# Patient Record
Sex: Female | Born: 1970 | Race: White | Hispanic: No | Marital: Married | Smoking: Never smoker
Health system: Southern US, Community
[De-identification: ages and names within clinical notes are randomized; demographics above are authoritative.]

## PROBLEM LIST (undated history)

## (undated) DIAGNOSIS — G43909 Migraine, unspecified, not intractable, without status migrainosus: Secondary | ICD-10-CM

## (undated) DIAGNOSIS — K589 Irritable bowel syndrome without diarrhea: Secondary | ICD-10-CM

## (undated) DIAGNOSIS — F329 Major depressive disorder, single episode, unspecified: Secondary | ICD-10-CM

## (undated) DIAGNOSIS — F419 Anxiety disorder, unspecified: Secondary | ICD-10-CM

## (undated) DIAGNOSIS — T7840XA Allergy, unspecified, initial encounter: Secondary | ICD-10-CM

## (undated) DIAGNOSIS — G629 Polyneuropathy, unspecified: Secondary | ICD-10-CM

## (undated) DIAGNOSIS — J45901 Unspecified asthma with (acute) exacerbation: Secondary | ICD-10-CM

## (undated) DIAGNOSIS — F32A Depression, unspecified: Secondary | ICD-10-CM

## (undated) DIAGNOSIS — J0101 Acute recurrent maxillary sinusitis: Secondary | ICD-10-CM

## (undated) HISTORY — DX: Irritable bowel syndrome, unspecified: K58.9

## (undated) HISTORY — DX: Polyneuropathy, unspecified: G62.9

## (undated) HISTORY — DX: Allergy, unspecified, initial encounter: T78.40XA

## (undated) HISTORY — DX: Acute recurrent maxillary sinusitis: J01.01

## (undated) HISTORY — DX: Depression, unspecified: F32.A

## (undated) HISTORY — DX: Anxiety disorder, unspecified: F41.9

## (undated) HISTORY — DX: Unspecified asthma with (acute) exacerbation: J45.901

## (undated) HISTORY — PX: LUMBAR DISC SURGERY: SHX700

## (undated) HISTORY — DX: Migraine, unspecified, not intractable, without status migrainosus: G43.909

## (undated) HISTORY — PX: NASAL SEPTUM SURGERY: SHX37

## (undated) HISTORY — DX: Major depressive disorder, single episode, unspecified: F32.9

---

## 2002-02-15 ENCOUNTER — Emergency Department (HOSPITAL_COMMUNITY): Admission: EM | Admit: 2002-02-15 | Discharge: 2002-02-15 | Payer: Self-pay | Admitting: Emergency Medicine

## 2002-02-23 ENCOUNTER — Encounter (INDEPENDENT_AMBULATORY_CARE_PROVIDER_SITE_OTHER): Payer: Self-pay | Admitting: *Deleted

## 2002-02-23 ENCOUNTER — Ambulatory Visit (HOSPITAL_COMMUNITY): Admission: RE | Admit: 2002-02-23 | Discharge: 2002-02-24 | Payer: Self-pay | Admitting: Specialist

## 2002-02-23 ENCOUNTER — Encounter: Payer: Self-pay | Admitting: Specialist

## 2002-07-08 ENCOUNTER — Ambulatory Visit (HOSPITAL_COMMUNITY): Admission: RE | Admit: 2002-07-08 | Discharge: 2002-07-08 | Payer: Self-pay

## 2002-10-02 ENCOUNTER — Encounter: Payer: Self-pay | Admitting: *Deleted

## 2002-10-02 ENCOUNTER — Ambulatory Visit (HOSPITAL_COMMUNITY): Admission: RE | Admit: 2002-10-02 | Discharge: 2002-10-02 | Payer: Self-pay

## 2003-01-25 ENCOUNTER — Inpatient Hospital Stay (HOSPITAL_COMMUNITY): Admission: AD | Admit: 2003-01-25 | Discharge: 2003-01-25 | Payer: Self-pay

## 2003-02-04 ENCOUNTER — Encounter: Admission: RE | Admit: 2003-02-04 | Discharge: 2003-02-04 | Payer: Self-pay

## 2003-02-25 ENCOUNTER — Ambulatory Visit (HOSPITAL_COMMUNITY): Admission: RE | Admit: 2003-02-25 | Discharge: 2003-02-25 | Payer: Self-pay

## 2003-02-25 ENCOUNTER — Encounter: Admission: RE | Admit: 2003-02-25 | Discharge: 2003-02-25 | Payer: Self-pay

## 2003-02-26 ENCOUNTER — Inpatient Hospital Stay (HOSPITAL_COMMUNITY): Admission: AD | Admit: 2003-02-26 | Discharge: 2003-03-01 | Payer: Self-pay

## 2004-02-18 ENCOUNTER — Encounter: Admission: RE | Admit: 2004-02-18 | Discharge: 2004-02-18 | Payer: Self-pay | Admitting: Specialist

## 2004-02-28 ENCOUNTER — Inpatient Hospital Stay (HOSPITAL_COMMUNITY): Admission: EM | Admit: 2004-02-28 | Discharge: 2004-03-01 | Payer: Self-pay | Admitting: Specialist

## 2004-02-29 ENCOUNTER — Encounter (INDEPENDENT_AMBULATORY_CARE_PROVIDER_SITE_OTHER): Payer: Self-pay | Admitting: *Deleted

## 2005-01-29 ENCOUNTER — Ambulatory Visit: Payer: Self-pay | Admitting: Family Medicine

## 2005-01-31 ENCOUNTER — Ambulatory Visit: Payer: Self-pay | Admitting: Family Medicine

## 2005-03-27 ENCOUNTER — Ambulatory Visit: Payer: Self-pay | Admitting: Internal Medicine

## 2006-01-01 ENCOUNTER — Inpatient Hospital Stay (HOSPITAL_COMMUNITY): Admission: AD | Admit: 2006-01-01 | Discharge: 2006-01-03 | Payer: Self-pay | Admitting: Obstetrics and Gynecology

## 2006-01-01 ENCOUNTER — Encounter (INDEPENDENT_AMBULATORY_CARE_PROVIDER_SITE_OTHER): Payer: Self-pay | Admitting: Specialist

## 2007-08-08 ENCOUNTER — Ambulatory Visit: Payer: Self-pay | Admitting: Family Medicine

## 2007-08-08 DIAGNOSIS — Z9189 Other specified personal risk factors, not elsewhere classified: Secondary | ICD-10-CM | POA: Insufficient documentation

## 2007-08-08 DIAGNOSIS — G609 Hereditary and idiopathic neuropathy, unspecified: Secondary | ICD-10-CM | POA: Insufficient documentation

## 2007-08-08 DIAGNOSIS — H6690 Otitis media, unspecified, unspecified ear: Secondary | ICD-10-CM | POA: Insufficient documentation

## 2007-08-22 ENCOUNTER — Telehealth: Payer: Self-pay | Admitting: Family Medicine

## 2008-03-18 ENCOUNTER — Ambulatory Visit: Payer: Self-pay | Admitting: Family Medicine

## 2008-06-17 ENCOUNTER — Ambulatory Visit: Payer: Self-pay | Admitting: Family Medicine

## 2008-06-17 DIAGNOSIS — J019 Acute sinusitis, unspecified: Secondary | ICD-10-CM

## 2008-06-17 DIAGNOSIS — B9689 Other specified bacterial agents as the cause of diseases classified elsewhere: Secondary | ICD-10-CM | POA: Insufficient documentation

## 2008-08-31 ENCOUNTER — Encounter: Admission: RE | Admit: 2008-08-31 | Discharge: 2008-08-31 | Payer: Self-pay | Admitting: Anesthesiology

## 2008-09-02 ENCOUNTER — Telehealth: Payer: Self-pay | Admitting: Internal Medicine

## 2008-10-26 ENCOUNTER — Ambulatory Visit: Payer: Self-pay | Admitting: Family Medicine

## 2008-10-26 DIAGNOSIS — R5383 Other fatigue: Secondary | ICD-10-CM

## 2008-10-26 DIAGNOSIS — R5381 Other malaise: Secondary | ICD-10-CM | POA: Insufficient documentation

## 2008-10-27 ENCOUNTER — Ambulatory Visit: Payer: Self-pay | Admitting: Cardiology

## 2008-11-01 LAB — CONVERTED CEMR LAB
AST: 18 units/L (ref 0–37)
Alkaline Phosphatase: 34 units/L — ABNORMAL LOW (ref 39–117)
BUN: 12 mg/dL (ref 6–23)
Basophils Absolute: 0 10*3/uL (ref 0.0–0.1)
Bilirubin, Direct: 0.1 mg/dL (ref 0.0–0.3)
Calcium: 9.6 mg/dL (ref 8.4–10.5)
Chloride: 103 meq/L (ref 96–112)
Eosinophils Absolute: 0.1 10*3/uL (ref 0.0–0.7)
GFR calc non Af Amer: 189 mL/min
Glucose, Bld: 93 mg/dL (ref 70–99)
HCT: 37.9 % (ref 36.0–46.0)
Hemoglobin: 13.4 g/dL (ref 12.0–15.0)
Lymphocytes Relative: 26.3 % (ref 12.0–46.0)
MCHC: 35.4 g/dL (ref 30.0–36.0)
MCV: 91.4 fL (ref 78.0–100.0)
Neutrophils Relative %: 60.7 % (ref 43.0–77.0)
RDW: 11.6 % (ref 11.5–14.6)

## 2008-11-11 ENCOUNTER — Telehealth: Payer: Self-pay | Admitting: Family Medicine

## 2008-11-11 ENCOUNTER — Telehealth: Payer: Self-pay | Admitting: Internal Medicine

## 2009-05-03 ENCOUNTER — Encounter: Admission: RE | Admit: 2009-05-03 | Discharge: 2009-05-03 | Payer: Self-pay | Admitting: Obstetrics and Gynecology

## 2009-07-12 ENCOUNTER — Ambulatory Visit: Payer: Self-pay | Admitting: Family Medicine

## 2009-07-12 DIAGNOSIS — J309 Allergic rhinitis, unspecified: Secondary | ICD-10-CM | POA: Insufficient documentation

## 2009-10-12 ENCOUNTER — Telehealth: Payer: Self-pay | Admitting: Family Medicine

## 2009-11-04 ENCOUNTER — Ambulatory Visit: Payer: Self-pay | Admitting: Family Medicine

## 2009-11-04 DIAGNOSIS — S139XXA Sprain of joints and ligaments of unspecified parts of neck, initial encounter: Secondary | ICD-10-CM | POA: Insufficient documentation

## 2010-02-03 ENCOUNTER — Ambulatory Visit: Payer: Self-pay | Admitting: Family Medicine

## 2010-02-07 ENCOUNTER — Telehealth: Payer: Self-pay | Admitting: Family Medicine

## 2010-04-04 ENCOUNTER — Encounter: Admission: RE | Admit: 2010-04-04 | Discharge: 2010-04-04 | Payer: Self-pay | Admitting: Anesthesiology

## 2010-04-14 ENCOUNTER — Telehealth: Payer: Self-pay | Admitting: Family Medicine

## 2010-04-17 ENCOUNTER — Encounter: Admission: RE | Admit: 2010-04-17 | Discharge: 2010-04-17 | Payer: Self-pay | Admitting: Obstetrics and Gynecology

## 2010-07-03 ENCOUNTER — Ambulatory Visit: Payer: Self-pay | Admitting: Family Medicine

## 2010-07-15 ENCOUNTER — Ambulatory Visit: Payer: Self-pay | Admitting: Family Medicine

## 2010-07-24 ENCOUNTER — Ambulatory Visit: Payer: Self-pay | Admitting: Family Medicine

## 2010-07-25 ENCOUNTER — Telehealth: Payer: Self-pay | Admitting: Family Medicine

## 2010-08-09 ENCOUNTER — Encounter: Admission: RE | Admit: 2010-08-09 | Discharge: 2010-08-09 | Payer: Self-pay | Admitting: Otolaryngology

## 2010-08-15 ENCOUNTER — Encounter: Payer: Self-pay | Admitting: Family Medicine

## 2010-08-23 IMAGING — CT CT HEAD W/O CM
2 of 3 series · 17 of 30 positions shown, 20 images · non-contrast
Comparison: None

CLINICAL DATA: Headache.  Sinus drainage.

CT HEAD WITHOUT CONTRAST
TECHNIQUE: Contiguous axial images were obtained from the base of
the skull through the vertex without contrast.

[Series 2: head_seq 4.5 h37s st · axial · 0.45mm/px · z∈[-160,-43]mm · 10 of 32 slices shown, 13 images]
[im 3/32  brain]
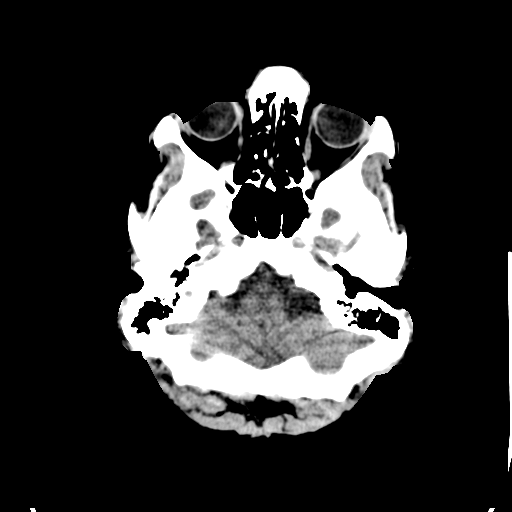
[im 3/32  bone]
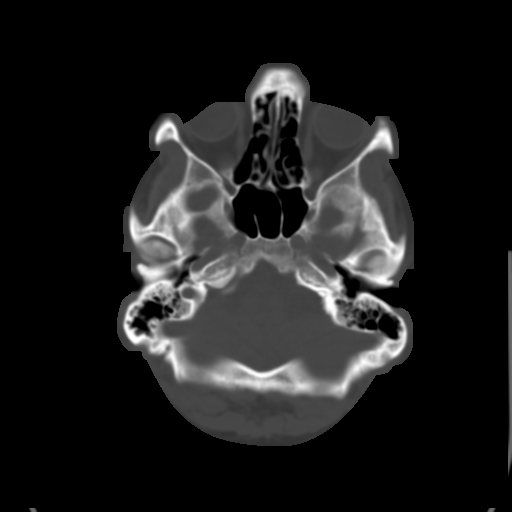
[im 6/32  brain]
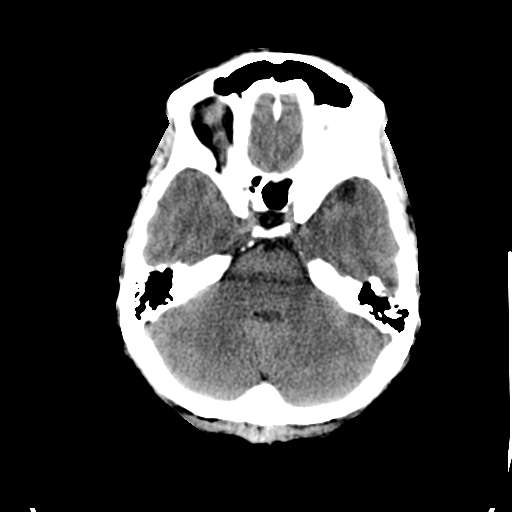
[im 9/32  brain]
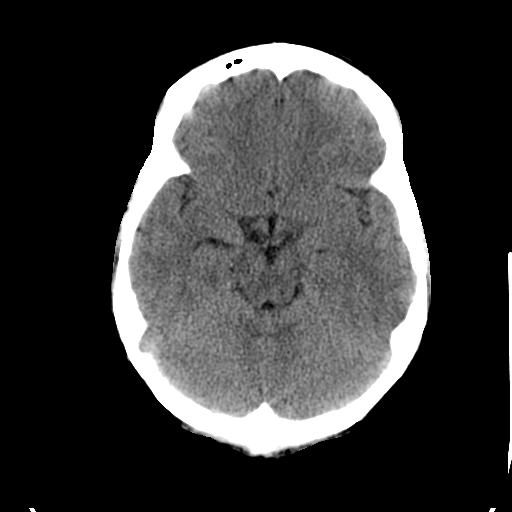
[im 12/32  brain]
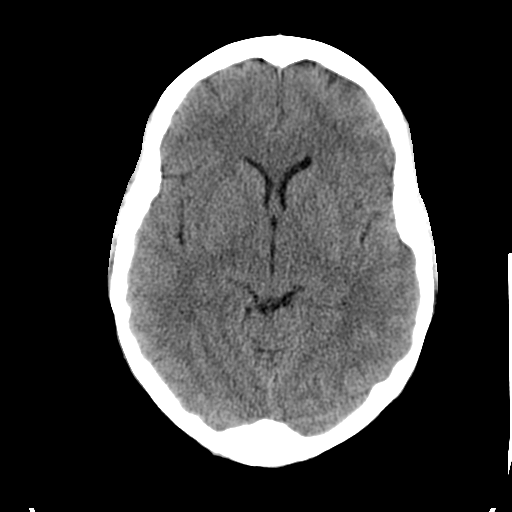
[im 15/32  brain]
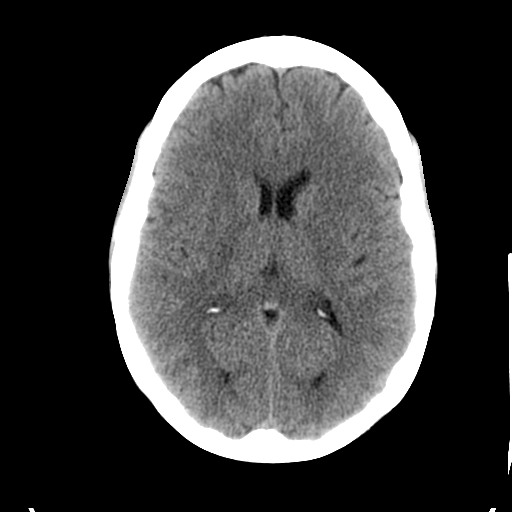
[im 15/32  bone]
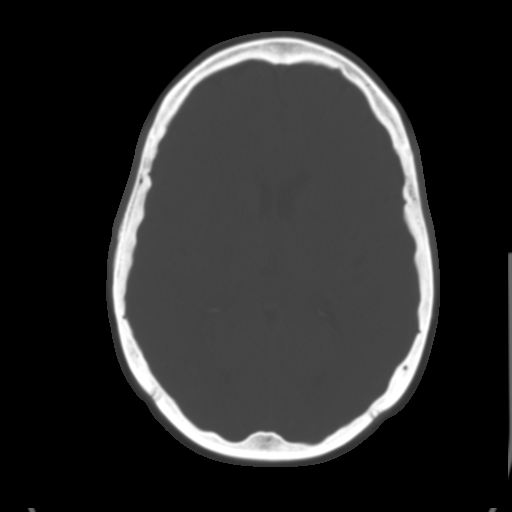
[im 17/32  brain]
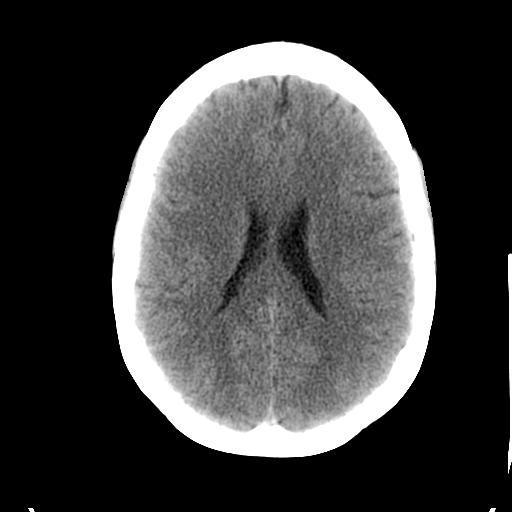
[im 20/32  brain]
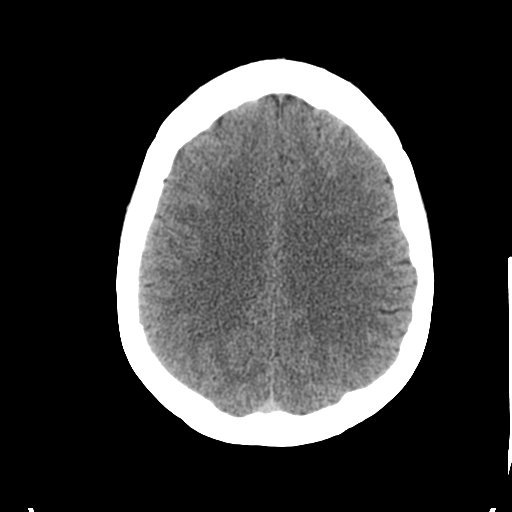
[im 23/32  brain]
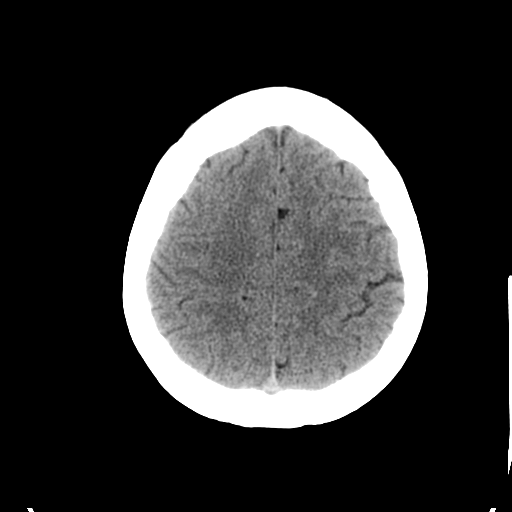
[im 26/32  brain]
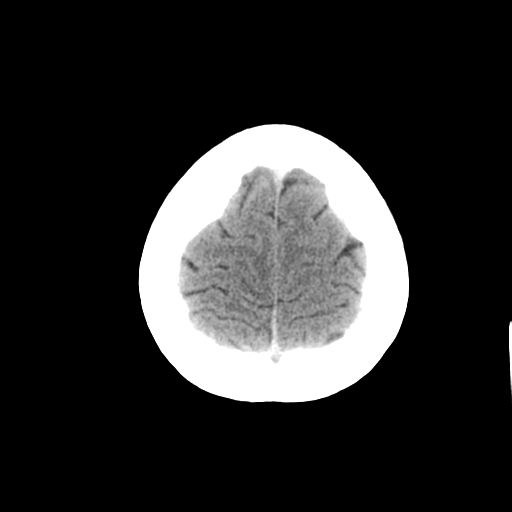
[im 26/32  bone]
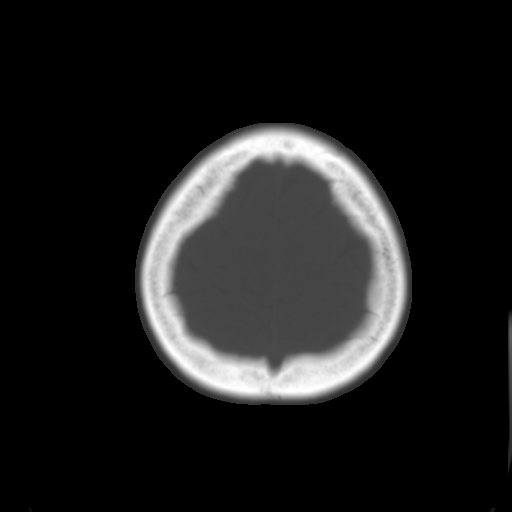
[im 29/32  brain]
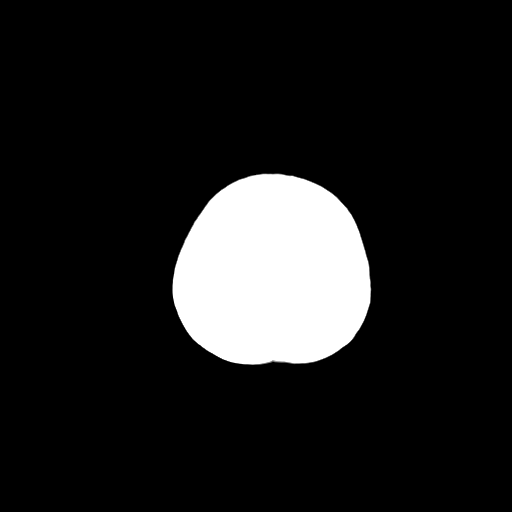

[Series 8: thins · axial · 0.29mm/px · z∈[-178,-138]mm · 7 of 40 slices shown]
[im 3/40  brain]
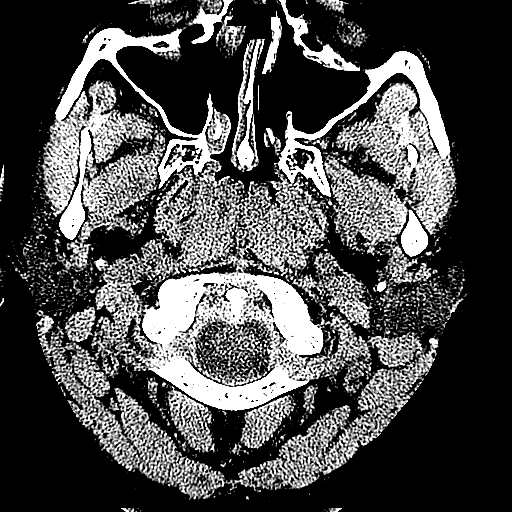
[im 8/40  brain]
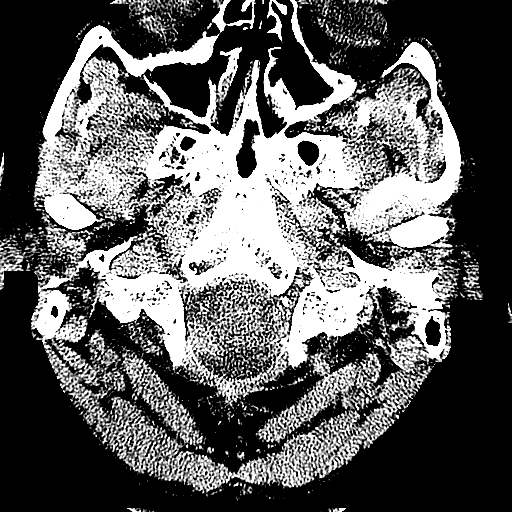
[im 14/40  brain]
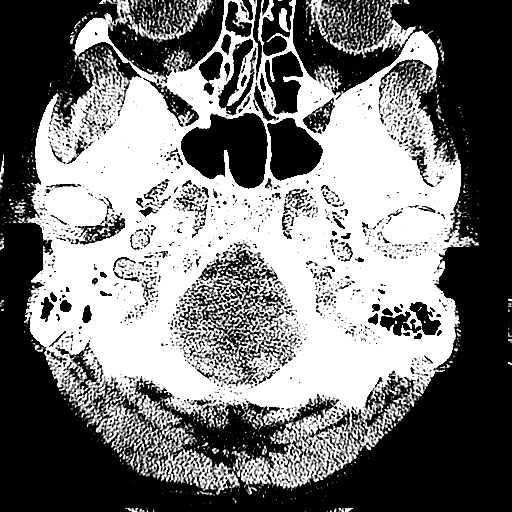
[im 19/40  brain]
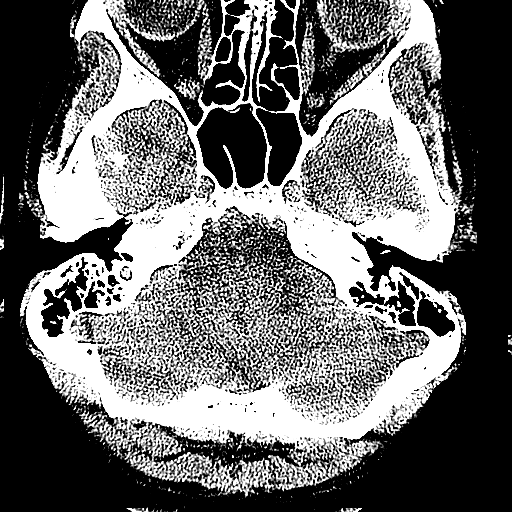
[im 21/40  brain]
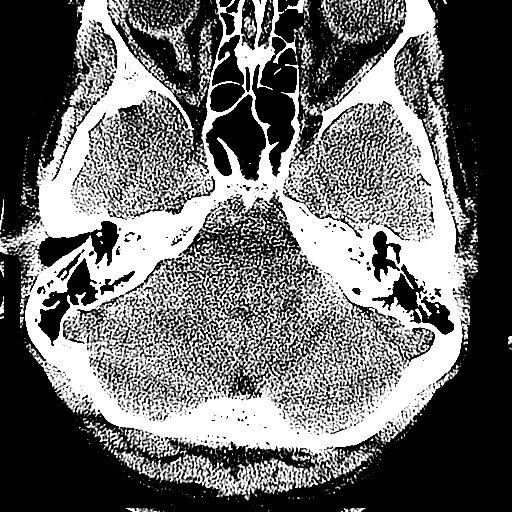
[im 27/40  brain]
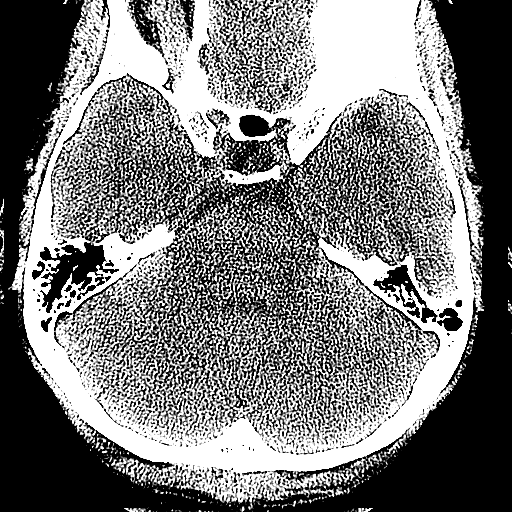
[im 32/40  brain]
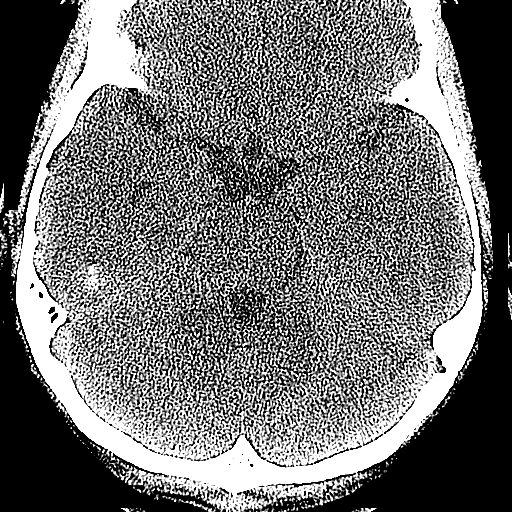

[17 of 30 positions shown; findings below may reference images not displayed]

FINDINGS: The brain has a normal appearance without evidence of
atrophy, infarction, mass lesion, hemorrhage, hydrocephalus or
extra-axial collection.  No fluid in the visualized mastoids,
middle ears and paranasal sinuses.  The patient has had bilateral
nasoantral window surgery.
IMPRESSION: Normal CT exam.  No abnormality of the brain.  No sign of
inflammatory sinus disease or mastoid disease.

## 2010-10-01 ENCOUNTER — Encounter: Payer: Self-pay | Admitting: Specialist

## 2010-10-10 NOTE — Assessment & Plan Note (Signed)
Summary: ?strep/cjr   Vital Signs:  Patient profile:   40 year old female Weight:      98.2 pounds O2 Sat:      99 % Temp:     98.2 degrees F Pulse rate:   88 / minute BP sitting:   130 / 82  (left arm)  Vitals Entered By: Pura Spice, RN (July 03, 2010 9:47 AM) CC: sore throat nausea diarrhea. states her children has strep    History of Present Illness: Here for 2 weeks of sinus pressure, HA, PND, and a ST. No fever or cough. She has had a lot of nausea without vomiting.   Allergies: 1)  ! Percocet  Past History:  Past Medical History: Reviewed history from 10/26/2008 and no changes required. Peripheral neuropathy, per Dian Situ and Dr. Vear Clock frequent sinusitis and otitis, sees Dr. Narda Bonds  Review of Systems  The patient denies anorexia, fever, weight loss, weight gain, vision loss, decreased hearing, hoarseness, chest pain, syncope, dyspnea on exertion, peripheral edema, prolonged cough, hemoptysis, abdominal pain, melena, hematochezia, severe indigestion/heartburn, hematuria, incontinence, genital sores, muscle weakness, suspicious skin lesions, transient blindness, difficulty walking, depression, unusual weight change, abnormal bleeding, enlarged lymph nodes, angioedema, breast masses, and testicular masses.    Physical Exam  General:  Well-developed,well-nourished,in no acute distress; alert,appropriate and cooperative throughout examination Head:  Normocephalic and atraumatic without obvious abnormalities. No apparent alopecia or balding. Eyes:  No corneal or conjunctival inflammation noted. EOMI. Perrla. Funduscopic exam benign, without hemorrhages, exudates or papilledema. Vision grossly normal. Ears:  External ear exam shows no significant lesions or deformities.  Otoscopic examination reveals clear canals, tympanic membranes are intact bilaterally without bulging, retraction, inflammation or discharge. Hearing is grossly normal bilaterally. Nose:   External nasal examination shows no deformity or inflammation. Nasal mucosa are pink and moist without lesions or exudates. Mouth:  Oral mucosa and oropharynx without lesions or exudates.  Teeth in good repair. Neck:  No deformities, masses, or tenderness noted. Lungs:  Normal respiratory effort, chest expands symmetrically. Lungs are clear to auscultation, no crackles or wheezes.   Impression & Recommendations:  Problem # 1:  ACUTE SINUSITIS, UNSPECIFIED (ICD-461.9)  Her updated medication list for this problem includes:    Flonase 50 Mcg/act Susp (Fluticasone propionate) .Marland Kitchen... 2 sprays each nostril once daily    Augmentin 875-125 Mg Tabs (Amoxicillin-pot clavulanate) .Marland Kitchen..Marland Kitchen Two times a day  Complete Medication List: 1)  Flonase 50 Mcg/act Susp (Fluticasone propionate) .... 2 sprays each nostril once daily 2)  Promethazine Hcl 25 Mg Supp (Promethazine hcl) .... One every 4 hours as needed nausea 3)  Augmentin 875-125 Mg Tabs (Amoxicillin-pot clavulanate) .... Two times a day 4)  Promethazine Hcl 25 Mg Tabs (Promethazine hcl) .Marland Kitchen.. 1 q 4 hours as needed nausea  Patient Instructions: 1)  Please schedule a follow-up appointment as needed .  Prescriptions: PROMETHAZINE HCL 25 MG TABS (PROMETHAZINE HCL) 1 q 4 hours as needed nausea  #30 x 5   Entered and Authorized by:   Nelwyn Salisbury MD   Signed by:   Nelwyn Salisbury MD on 07/03/2010   Method used:   Electronically to        [id number] (retail)       [address]       [place], [place]  [address]       Ph: [phone] or [phone]       Fax: [phone]   RxID:   [id number] AUGMENTIN [id number]  MG TABS (AMOXICILLIN-POT CLAVULANATE) two times a day  #20 x 0   Entered and Authorized by:   Nelwyn Salisbury MD   Signed by:   Nelwyn Salisbury MD on 07/03/2010   Method used:   Electronically to        [id number] (retail)       [address]       [place], [place]  [address]       Ph: [phone] or [phone]       Fax:  [phone]   RxID:   [id number]    Orders Added: 1)  Est. Patient Level IV [[address]]

## 2010-10-10 NOTE — Progress Notes (Signed)
Summary: not better  Phone Note Call from Patient Call back at Home Phone [phone]   Summary of Call: 1/2 way through med & not better.  Unable to sleep.  Stay course or change med?  [facility]  Allergic Percocet.   Initial call taken by: Rudy Jew, RN,  Feb 07, 2010 9:03 AM  Follow-up for Phone Call        I would continue the Levaquin, it is a very good antibiotic Follow-up by: Nelwyn Salisbury MD,  Feb 07, 2010 9:22 AM  Additional Follow-up for Phone Call Additional follow up Details #1::        Phone Call Completed Additional Follow-up by: Rudy Jew, RN,  Feb 07, 2010 2:14 PM

## 2010-10-10 NOTE — Assessment & Plan Note (Signed)
Summary: pulled muscle in neck and upper back/painful/cjr   Vital Signs:  Patient profile:   40 year old female Weight:      187 pounds Temp:     98.8 degrees F oral Pulse rate:   81 / minute BP sitting:   132 / 80  (left arm) Cuff size:   regular  Vitals Entered By: Alfred Levins, CMA (November 04, 2009 3:13 PM) CC: pulled muscle in neck yesterday   History of Present Illness: Here for what she thinks is a pulled muscle in her neck which occurred yesterday. She was standing beside a friend's car when the car alarm suddenly went off. This startled her and she jumped, and this twisted her neck a bit. Now she has pain and stiffness in the upper bacl betwee the shoulder blades and in the posterior neck. Using Tylenol and heat.  Allergies: 1)  ! Percocet  Past History:  Past Medical History: Reviewed history from 10/26/2008 and no changes required. Peripheral neuropathy, per Dian Situ and Dr. Vear Clock frequent sinusitis and otitis, sees Dr. Narda Bonds  Past Surgical History: Herinated lumbar Disk repair x2 Sinus surgery 1994 deviated septum repair   Review of Systems  The patient denies anorexia, fever, weight loss, weight gain, vision loss, decreased hearing, hoarseness, chest pain, syncope, dyspnea on exertion, peripheral edema, prolonged cough, headaches, hemoptysis, abdominal pain, melena, hematochezia, severe indigestion/heartburn, hematuria, incontinence, genital sores, muscle weakness, suspicious skin lesions, transient blindness, difficulty walking, depression, unusual weight change, abnormal bleeding, enlarged lymph nodes, angioedema, breast masses, and testicular masses.    Physical Exam  General:  Well-developed,well-nourished,in no acute distress; alert,appropriate and cooperative throughout examination Neck:  tender posteriorly with spasm and reduced ROM Msk:  tender in the upper thoracic area with reduced ROM   Impression & Recommendations:  Problem # 1:   NECK SPRAIN AND STRAIN (ICD-847.0)  Her updated medication list for this problem includes:    Diclofenac Sodium 50 Mg Tbec (Diclofenac sodium) .Marland Kitchen... Three times a day as needed pain    Flexeril 10 Mg Tabs (Cyclobenzaprine hcl) .Marland Kitchen... Three times a day as needed spasm  Complete Medication List: 1)  Flonase 50 Mcg/act Susp (Fluticasone propionate) .... 2 sprays each nostril once daily 2)  Promethazine Hcl 25 Mg Supp (Promethazine hcl) .... One every 4 hours as needed nausea 3)  Diclofenac Sodium 50 Mg Tbec (Diclofenac sodium) .... Three times a day as needed pain 4)  Flexeril 10 Mg Tabs (Cyclobenzaprine hcl) .... Three times a day as needed spasm  Patient Instructions: 1)  rest, stretches,  2)  Please schedule a follow-up appointment as needed .  Prescriptions: FLEXERIL 10 MG TABS (CYCLOBENZAPRINE HCL) three times a day as needed spasm  #60 x 2   Entered and Authorized by:   Nelwyn Salisbury MD   Signed by:   Nelwyn Salisbury MD on 11/04/2009   Method used:   Electronically to        [id number] (retail)       [address]       [place], [place]  [address]       Ph: [phone] or [phone]       Fax: [phone]   RxID:   [id number] DICLOFENAC SODIUM 50 MG TBEC (DICLOFENAC SODIUM) three times a day as needed pain  #60 x 2   Entered and Authorized by:   Nelwyn Salisbury MD   Signed by:   Nelwyn Salisbury MD on 11/04/2009  Method used:   Electronically to        [id number] (retail)       [address]       [place], [place]  [address]       Ph: [phone] or [phone]       Fax: [phone]   RxID:   [id number]

## 2010-10-10 NOTE — Assessment & Plan Note (Signed)
Summary: SINUS INFECTION/DLO   Vital Signs:  Patient profile:   40 year old female Weight:      182 pounds O2 Sat:      99 % on Room air Temp:     97.5 degrees F oral Pulse rate:   95 / minute BP sitting:   122 / 82  (left arm) Cuff size:   regular  Vitals Entered By: Bill Salinas CMA (July 15, 2010 9:29 AM)  O2 Flow:  Room air CC: pt here for sinus infection that has gotten progressively worse after taking round of amoxicillen/ab   History of Present Illness: Saw Dr. Clent Ridges about 12 days ago.  Started on amoxilx10days and isn't doing much better.  Trouble sleeping.  Stuffy, rhinorrhea, congested.  No fever.  Pressure across her face.  Minimal cough but a little postnasal gtt.  No sputum.  Not short of breath.  Mouth breathing.  Normally on flonase, but this has been difficult to use recently.  Has a neti pot.  H/o sinus infections and sinus surgery in  ~94.  H/o mult AOM.    Current Medications (verified): 1)  Flonase 50 Mcg/act Susp (Fluticasone Propionate) .... 2 Sprays Each Nostril Once Daily 2)  Promethazine Hcl 25 Mg Supp (Promethazine Hcl) .... One Every 4 Hours As Needed Nausea 3)  Promethazine Hcl 25 Mg Tabs (Promethazine Hcl) .Marland Kitchen.. 1 Q 4 Hours As Needed Nausea  Allergies (verified): 1)  ! Percocet  Review of Systems       See HPI.  Otherwise negative.    Physical Exam  General:  GEN: nad, alert and oriented HEENT: mucous membranes moist, TM w/o erythema but old chronic changes noted, nasal epithelium injected, OP with cobblestoning, max sinuses tender to palpation bilaterally NECK: supple w/o LA CV: rrr. PULM: ctab, no inc wob   Impression & Recommendations:  Problem # 1:  ACUTE SINUSITIS, UNSPECIFIED (ICD-461.9) Start back on nasal saline, flonase, and start zmax.  If not improving, follow up with PMD for ENT consideration.  She agrees.  Her updated medication list for this problem includes:    Flonase 50 Mcg/act Susp (Fluticasone propionate) .Marland Kitchen... 2 sprays  each nostril once daily    Zithromax 250 Mg Tabs (Azithromycin) .Marland Kitchen... 2 by mouth today and then 1 by mouth for 4 days  Complete Medication List: 1)  Flonase 50 Mcg/act Susp (Fluticasone propionate) .... 2 sprays each nostril once daily 2)  Promethazine Hcl 25 Mg Supp (Promethazine hcl) .... One every 4 hours as needed nausea 3)  Promethazine Hcl 25 Mg Tabs (Promethazine hcl) .Marland Kitchen.. 1 q 4 hours as needed nausea 4)  Zithromax 250 Mg Tabs (Azithromycin) .... 2 by mouth today and then 1 by mouth for 4 days  Patient Instructions: 1)  Start the antibiotics today and use nasal saline/flonase daily.  follow up with your regular doctor if you aren't improving.  Take care.  Prescriptions: ZITHROMAX 250 MG TABS (AZITHROMYCIN) 2 by mouth today and then 1 by mouth for 4 days  #6 x 0   Entered and Authorized by:   Crawford Givens MD   Signed by:   Crawford Givens MD on 07/15/2010   Method used:   Electronically to        [id number] ([facility]ail)       [address]       [place], [place]  [address]       Ph: [phone] or [phone]       Fax:  [phone]   RxID:   [id number]    Orders Added: 1)  Est. Patient Level III [47425]

## 2010-10-10 NOTE — Assessment & Plan Note (Signed)
Summary: ?sinus inf/ear inf/njr   Vital Signs:  Patient profile:   40 year old female Weight:      178 pounds O2 Sat:      98 % Temp:     98.3 degrees F Pulse rate:   117 / minute BP sitting:   124 / 80  (left arm)  Vitals Entered By: Pura Spice, RN (July 24, 2010 11:14 AM) CC: sinus inf for 3 wks completed amoxicillin went to urgent care last sat was given z pack c/o right ear pain   History of Present Illness: Here for a worsening sinusitis over the past 3 weeks despite taking a course of Augmentin as well as a Zpack. These did not seem to help at all. She has constant HA, sinus pressure , ear pressure, nasal congestion, and a dry cough. No fever. Using Flonase, Mucinex, a Neti Pot, etc. She has already made an appt. with ENT to discuss the possibiulity of repeat sinus surgery.   Allergies: 1)  ! Percocet  Past History:  Past Medical History: Reviewed history from 10/26/2008 and no changes required. Peripheral neuropathy, per Dian Situ and Dr. Vear Clock frequent sinusitis and otitis, sees Dr. Narda Bonds  Past Surgical History: Reviewed history from 11/04/2009 and no changes required. Herinated lumbar Disk repair x2 Sinus surgery 1994 deviated septum repair   Review of Systems  The patient denies anorexia, fever, weight loss, weight gain, vision loss, decreased hearing, hoarseness, chest pain, syncope, dyspnea on exertion, peripheral edema, hemoptysis, abdominal pain, melena, hematochezia, severe indigestion/heartburn, hematuria, incontinence, genital sores, muscle weakness, suspicious skin lesions, transient blindness, difficulty walking, depression, unusual weight change, abnormal bleeding, enlarged lymph nodes, angioedema, breast masses, and testicular masses.    Physical Exam  General:  Well-developed,well-nourished,in no acute distress; alert,appropriate and cooperative throughout examination Head:  Normocephalic and atraumatic without obvious abnormalities.  No apparent alopecia or balding. Eyes:  No corneal or conjunctival inflammation noted. EOMI. Perrla. Funduscopic exam benign, without hemorrhages, exudates or papilledema. Vision grossly normal. Ears:  External ear exam shows no significant lesions or deformities.  Otoscopic examination reveals clear canals, tympanic membranes are intact bilaterally without bulging, retraction, inflammation or discharge. Hearing is grossly normal bilaterally. Nose:  External nasal examination shows no deformity or inflammation. Nasal mucosa are pink and moist without lesions or exudates. Mouth:  Oral mucosa and oropharynx without lesions or exudates.  Teeth in good repair. Neck:  No deformities, masses, or tenderness noted. Lungs:  Normal respiratory effort, chest expands symmetrically. Lungs are clear to auscultation, no crackles or wheezes.   Impression & Recommendations:  Problem # 1:  ACUTE SINUSITIS, UNSPECIFIED (ICD-461.9)  The following medications were removed from the medication list:    Zithromax 250 Mg Tabs (Azithromycin) .Marland Kitchen... 2 by mouth today and then 1 by mouth for 4 days Her updated medication list for this problem includes:    Flonase 50 Mcg/act Susp (Fluticasone propionate) .Marland Kitchen... 2 sprays each nostril once daily    Levaquin 500 Mg Tabs (Levofloxacin) ..... Once daily  Orders: Depo- Medrol 80mg  (J1040) Depo- Medrol 40mg  (J1030) Admin of Therapeutic Inj  intramuscular or subcutaneous ([address])  Complete Medication List: 1)  Flonase 50 Mcg/act Susp (Fluticasone propionate) .... 2 sprays each nostril once daily 2)  Promethazine Hcl 25 Mg Supp (Promethazine hcl) .... One every 4 hours as needed nausea 3)  Promethazine Hcl 25 Mg Tabs (Promethazine hcl) .Marland Kitchen.. 1 q 4 hours as needed nausea 4)  Levaquin 500 Mg Tabs (Levofloxacin) .... Once daily  5)  Prednisone (pak) 10 Mg Tabs (Prednisone) .... As directed for 12 days  Patient Instructions: 1)  follow up with Dr. Ezzard Standing as above   Prescriptions: PREDNISONE (PAK) 10 MG TABS (PREDNISONE) as directed for 12 days  #1 x 0   Entered and Authorized by:   Nelwyn Salisbury MD   Signed by:   Nelwyn Salisbury MD on 07/24/2010   Method used:   Electronically to        [id number] ([facility])       [address]       [place], [place]  [address]       Ph: [phone] or [phone]       Fax: [phone]   RxID:   [id number] LEVAQUIN 500 MG TABS (LEVOFLOXACIN) once daily  #10 x 0   Entered and Authorized by:   Nelwyn Salisbury MD   Signed by:   Nelwyn Salisbury MD on 07/24/2010   Method used:   Electronically to        [id number] ([facility]ail)       [address]       [place], [place]  [address]       Ph: [phone] or [phone]       Fax: [phone]   RxID:   [id number]    Medication Administration  Injection # 1:    Medication: Depo- Medrol 80mg     Diagnosis: ACUTE SINUSITIS, UNSPECIFIED (ICD-461.9)    Route: IM    Site: RUOQ gluteus    Exp Date: 01/2013    Lot #: [id number]    Mfr: Pharmacia    Patient tolerated injection without complications    Given by: Pura Spice, RN (July 24, 2010 12:14 PM)  Injection # 2:    Medication: Depo- Medrol 40mg     Diagnosis: ACUTE SINUSITIS, UNSPECIFIED (ICD-461.9)    Route: IM    Site: RUOQ gluteus    Exp Date: 01/2013    Lot #: [id number]    Mfr: Pharmacia    Patient tolerated injection without complications    Given by: Pura Spice, RN (July 24, 2010 12:15 PM)  Orders Added: 1)  Est. Patient Level IV [18299] 2)  Depo- Medrol 80mg  [J1040] 3)  Depo- Medrol 40mg  [J1030] 4)  Admin of Therapeutic Inj  intramuscular or subcutaneous [[address]]

## 2010-10-10 NOTE — Assessment & Plan Note (Signed)
Summary: SINUSITIS // RS   Vital Signs:  Patient profile:   40 year old female Weight:      188 pounds Temp:     98.3 degrees F oral Pulse rate:   100 / minute Pulse rhythm:   regular BP sitting:   126 / 90  (left arm) Cuff size:   regular  Vitals Entered By: Raechel Ache, RN (Feb 03, 2010 1:27 PM) CC: Had cold symptoms last week now has facial pain, L ear hurts and some congestion.   History of Present Illness: Here for 5 days of facial pain, sinus pressure, HA, and ST. No cough or fever.   Allergies: 1)  ! Percocet  Past History:  Past Medical History: Reviewed history from 10/26/2008 and no changes required. Peripheral neuropathy, per Dian Situ and Dr. Vear Clock frequent sinusitis and otitis, sees Dr. Narda Bonds  Review of Systems  The patient denies anorexia, fever, weight loss, weight gain, vision loss, decreased hearing, hoarseness, chest pain, syncope, dyspnea on exertion, peripheral edema, prolonged cough, hemoptysis, abdominal pain, melena, hematochezia, severe indigestion/heartburn, hematuria, incontinence, genital sores, muscle weakness, suspicious skin lesions, transient blindness, difficulty walking, depression, unusual weight change, abnormal bleeding, enlarged lymph nodes, angioedema, breast masses, and testicular masses.    Physical Exam  General:  Well-developed,well-nourished,in no acute distress; alert,appropriate and cooperative throughout examination Head:  Normocephalic and atraumatic without obvious abnormalities. No apparent alopecia or balding. Eyes:  No corneal or conjunctival inflammation noted. EOMI. Perrla. Funduscopic exam benign, without hemorrhages, exudates or papilledema. Vision grossly normal. Ears:  External ear exam shows no significant lesions or deformities.  Otoscopic examination reveals clear canals, tympanic membranes are intact bilaterally without bulging, retraction, inflammation or discharge. Hearing is grossly normal  bilaterally. Nose:  External nasal examination shows no deformity or inflammation. Nasal mucosa are pink and moist without lesions or exudates. Mouth:  Oral mucosa and oropharynx without lesions or exudates.  Teeth in good repair. Neck:  No deformities, masses, or tenderness noted. Lungs:  Normal respiratory effort, chest expands symmetrically. Lungs are clear to auscultation, no crackles or wheezes.   Impression & Recommendations:  Problem # 1:  ACUTE SINUSITIS, UNSPECIFIED (ICD-461.9)  Her updated medication list for this problem includes:    Flonase 50 Mcg/act Susp (Fluticasone propionate) .Marland Kitchen... 2 sprays each nostril once daily    Levaquin 500 Mg Tabs (Levofloxacin) ..... Once daily  Complete Medication List: 1)  Flonase 50 Mcg/act Susp (Fluticasone propionate) .... 2 sprays each nostril once daily 2)  Promethazine Hcl 25 Mg Supp (Promethazine hcl) .... One every 4 hours as needed nausea 3)  Diclofenac Sodium 50 Mg Tbec (Diclofenac sodium) .... Three times a day as needed pain 4)  Flexeril 10 Mg Tabs (Cyclobenzaprine hcl) .... Three times a day as needed spasm 5)  Levaquin 500 Mg Tabs (Levofloxacin) .... Once daily  Patient Instructions: 1)  Add Mucinex D. 2)  Please schedule a follow-up appointment as needed .  Prescriptions: LEVAQUIN 500 MG TABS (LEVOFLOXACIN) once daily  #10 x 0   Entered and Authorized by:   Nelwyn Salisbury MD   Signed by:   Nelwyn Salisbury MD on 02/03/2010   Method used:   Electronically to        [id number]       [address]       [place], [place]  [address]       Ph: [phone] or [phone]       Fax: [phone]  RxID:   [id number]    Medication Administration  Injection # 1:    Medication: Depo- Medrol 80mg     Diagnosis: ACUTE SINUSITIS, UNSPECIFIED (ICD-461.9)    Route: IM    Site: RUOQ gluteus    Exp Date: 11/13    Lot #: [id number]    Mfr: Pharmacia    Comments: 120mg  given    Patient tolerated injection without  complications    Given by: Raechel Ache, RN (Feb 03, 2010 2:12 PM)  Orders Added: 1)  Est. Patient Level IV [95621]

## 2010-10-10 NOTE — Progress Notes (Signed)
Summary: Call-A-Nurse Report    Call-A-Nurse Triage Call Report Triage Record Num: [id number] Operator: Albertine Grates Patient Name: Kristine Sims Call Date & Time: 07/23/2010 4:39:11PM Patient Phone: [phone] PCP: Tera Mater. Clent Ridges Patient Gender: Female PCP Fax : [phone] Patient DOB: Apr 23, 1971 [facility] Name[facility] Reason for Call: Was seen in office 3 weeks ago and given antibiotic for sinus infection. Was seen again 1 week ago and given Z Pak. Has taken and has not gotten better. Has not been able to sleep due to discomfort. Has "fluid in ears" and are popping. Afebrile. Has also taken Advil Cold and Sinus, 4 Benadryl, Advil Severe Congestion and Mucinex but has not helped. Advised ED. Protocol(s) Used: Upper Respiratory Infection (URI) Recommended Outcome per Protocol: Call Provider Immediately Reason for Outcome: Moderate to severe headache unrelieved by nonprescription medication Care Advice:  ~ 07/23/2010 4:49:11PM Page 1 of 1 [id number]

## 2010-10-10 NOTE — Progress Notes (Signed)
Summary: reaction  Phone Note Call from Patient   Summary of Call: Reaction of stinging & burning after cutting up pablono peppers.  Has tried aloe vera with no help.  Will try benadryl & ice pack.  Declined OV. Initial call taken by: Rudy Jew, RN,  April 14, 2010 3:27 PM

## 2010-10-10 NOTE — Progress Notes (Signed)
Summary: phen supp for mha rf  Phone Note Call from Patient Call back at Home Phone [phone]   Summary of Call: Called Mon to you & [facility] for refill suppositories Phenergan for migraines & never heard back from anybody.  Request call. Initial call taken by: Rudy Jew, RN,  October 12, 2009 11:58 AM  Follow-up for Phone Call        by the way no one ever contacted [place] about this until now. At any rate, call in Phenergan 25 suppositories q 4 hours as needed nausea, #30 with 5 rf Follow-up by: Nelwyn Salisbury MD,  October 12, 2009 12:57 PM    New/Updated Medications: PROMETHAZINE HCL 25 MG SUPP (PROMETHAZINE HCL) One every 4 hours as needed nausea Prescriptions: PROMETHAZINE HCL 25 MG SUPP (PROMETHAZINE HCL) One every 4 hours as needed nausea  #30 x 5   Entered by:   Rudy Jew, RN   Authorized by:   Nelwyn Salisbury MD   Signed by:   Rudy Jew, RN on 10/12/2009   Method used:   Electronically to        [facility] (ret[facility]       [address]       [place], [place]  [address]       Ph: [phone] or [phone]       Fax: [phone]   RxID:   [id number]

## 2010-10-12 NOTE — Letter (Signed)
Summary: ENT-Dr. Narda Bonds  ENT-Dr. Narda Bonds   Imported By: Maryln Gottron 09/05/2010 12:05:31  _____________________________________________________________________  External Attachment:    Type:   Image     Comment:   External Document

## 2010-11-10 ENCOUNTER — Encounter: Payer: Self-pay | Admitting: Family Medicine

## 2010-11-10 ENCOUNTER — Ambulatory Visit (INDEPENDENT_AMBULATORY_CARE_PROVIDER_SITE_OTHER): Payer: 59 | Admitting: Family Medicine

## 2010-11-10 DIAGNOSIS — J309 Allergic rhinitis, unspecified: Secondary | ICD-10-CM

## 2010-11-10 DIAGNOSIS — Z9109 Other allergy status, other than to drugs and biological substances: Secondary | ICD-10-CM

## 2010-11-10 DIAGNOSIS — G43909 Migraine, unspecified, not intractable, without status migrainosus: Secondary | ICD-10-CM

## 2010-11-10 MED ORDER — SUMATRIPTAN SUCCINATE 100 MG PO TABS: 100.0000 mg | ORAL_TABLET | Freq: Once | ORAL | Status: DC | PRN

## 2010-11-10 NOTE — Progress Notes (Signed)
  Subjective:    Patient ID: Kristine Sims, female    DOB: 05/08/1971, 40 y.o.   MRN: [id number]  HPI Here for 2 things. First she would like a referral to an allergist for her allergies. She gets itchy eyes, sneezing, etc. Despite taking antihistamines and nasal steroid sprays. Second her migraines are becoming more frequent and more severe. She now gets them about once a month, and she has found that getting insufficient sleep can be a trigger. No auras. She had been using Ibuprofen and Phenergan, but these are no longer effective.    Review of Systems  Constitutional: Negative.   HENT: Positive for rhinorrhea, sneezing and postnasal drip.   Eyes: Positive for itching. Negative for discharge and visual disturbance.  Respiratory: Negative.   Neurological: Positive for headaches.       Objective:   Physical Exam  Constitutional: She is oriented to person, place, and time. She appears well-developed and well-nourished.  HENT:  Head: Normocephalic and atraumatic.  Right Ear: External ear normal.  Left Ear: External ear normal.  Nose: Nose normal.  Mouth/Throat: Oropharynx is clear and moist.  Eyes: Conjunctivae are normal. Pupils are equal, round, and reactive to light.  Pulmonary/Chest: Effort normal and breath sounds normal.  Lymphadenopathy:    She has no cervical adenopathy.  Neurological: She is alert and oriented to person, place, and time. She has normal reflexes. She displays normal reflexes. No cranial nerve deficit. She exhibits normal muscle tone. Coordination normal.          Assessment & Plan:  Try Sumatriptan for the migraines. Refer to Allergy

## 2011-01-26 NOTE — Discharge Summary (Signed)
Kristine Sims, Kristine Sims NO.:  [id number]   MEDICAL RECORD NO.:  [id number]          PATIENT TYPE:  INP   LOCATION:  9130                          [facility]ACILITY:  [facility]   PHYSICIAN:  Lenoard Aden, M.D.DATE OF BIRTH:  [birth year]   DATE OF ADMISSION:  01/01/2006  DATE OF DISCHARGE:  01/03/2006                                 DISCHARGE SUMMARY   The patient underwent uncomplicated repeat Cesarean section 01/01/2006 with  tubal ligation.  Postoperative course uncomplicated.  __________.  Hemoglobin 10.6.  Discharged to home day three.   Discharge medications to include prenatal vitamins, iron and Tylox.   Followup in the office in three days for staple removal, and then in four to  six weeks.      Lenoard Aden, M.D.  Electronically Signed     RJT/MEDQ  D:  04/02/2006  T:  04/03/2006  Job:  [id number]

## 2011-01-26 NOTE — Assessment & Plan Note (Signed)
[facility]                                 ON-CALL NOTE   NAME:Kristine Sims, Cornia                        MRN:          [id number]  DATE:07/17/2009                            DOB:          October 25, 1970    PHONE NUMBER:  [phone].   PRIMARY CARE PHYSICIAN:  Jeannett Senior A. Clent Ridges, MD   SUBJECTIVE:  Ms. Kristine Sims was seen earlier in the week for a sinus  infection.  At that point in time, she was given antibiotics as well as  a steroid injection.  She states that in the last few days, she has been  having severe headaches that feels like her migraines.  She has some  Phenergan suppositories to use at home for nausea but is requesting pain  medication.  She has never been prescribed any other pain medication for  her migraines in the past.   ASSESSMENT/PLAN:  Migraine and sinus infection:  Discussed that we do  not prescribe pain medication over the phone, and she was instructed to  use ibuprofen 4 tablets every 8 hours for a maximum dose of 800 mg as  well as her nausea medication to get a symbiotic effect.  If her pain is  not controlled with that, then she was instructed to go to Urgent Care  for evaluation or await Monday morning to see her regular doctor.     Kerby Nora, MD  Electronically Signed    AB/MedQ  DD: 07/17/2009  DT: 07/18/2009  Job #: [id number]

## 2011-01-26 NOTE — Op Note (Signed)
[place]  Patient:    Kristine Sims, Kristine Sims Visit Number: [id number] MRN: [id number]          Service Type: DSU Location: [phone] Attending Physician:  Pierce Crane Dictated by:   Javier Docker, M.D. Proc. Date: 02/23/02 Admit Date:  02/23/2002 Discharge Date: 02/24/2002                             Operative Report  PREOPERATIVE DIAGNOSIS:  Herniated nucleus pulposus, L5-S1 left.  POSTOPERATIVE DIAGNOSIS:  Herniated nucleus pulposus, L5-S1 left.  OPERATION PERFORMED:  Microdiskectomy L5-S1 left, lateral recess decompression for L5 foraminotomy.  SURGEON:  Javier Docker, M.D.  ASSISTANTJill Side Mahar, P.A.C.  ANESTHESIA:  General.  BRIEF HISTORY AND INDICATIONS:  The patient is a 40 year old female who has had refractory S1 radicular pain dimininished from _____ to plantar flexion. The patient had a focal HNP at L5-S1 compressing the S1 nerve root.  The patient demonstrated persistent positive neural tension signs. She ____ sensation in the S1 dermatome, decreased Achilles.  The patient failed conservative treatment including steroid injections.  Due to the persistent deficit and focal disk herniation, compression of the root, operative intervention is indicated for decompression.  MRI indicated last space at L5-S1 extruded fragment posterolaterally.  There was a sacralized lumbar vertebral body on the AT and a spina bifida occulta at L-S1.  We took the last open disk on the MRI as the area of the disk herniation.  Risks and benefits discussed including bleeding, infection, damage to vascular structures, CSF leak, epidural fibrosis, need for fusion in the future, persistent pain, numbness, tingling etc.  The patient preoperatively had some intermittent what she called leakage of urine with stress.  Again, no evidence of a large disk herniation on recurrent MRI.  DESCRIPTION OF PROCEDURE:  Placed supine position.  After  adequate induction of general anesthesia, 1 g of Kefzol, she was placed prone on the [place] frame, all bony prominences well padded.  Lumbar region was prepped and draped in the usual sterile fashion.  Two 18-gauge spinal needles were utilized to localize the L5-S1 interspace and confirmed with x-rays.  Incision was made at the spinous process of 5-S1, subcutaneous tissues dissected, electrocautery utilized to achieve hemostasis.  The dorsolumbar fascia identified and divided along the line of the skin incision.  Second picture was obtained with the Kocher on the spinous process of 5.  Paraspinous muscles elevated from the lamina of 5 and S1.  McCullough retractor was placed.  Operating microscope was draped and brought into the surgical field.  Hemilaminotomy of the caudad edge of L5 was performed with 2 and 3 mm Kerrison with a fairly oblique interlaminar space here noted.  The ligamentum flavum was detached from the cephalad edge of S1 utilizing the straight curet.  Hemilaminotomy was then performed with a 2 mm Kerrison, freeing the ____________ .  Neural patty was placed beneath the ligamentum flavum to protect the neural elements and the ligamentum flavum was removed from the interspace.  Significant compression was noted in the lateral recess of the S1 nerve root compressed against the lateral recess, there was a complex epidural venous plexus noted with a vein draping over the S1 nerve root causing compression.  Foraminotomy was performed to free up the S1 nerve root.  The encircling vein was held up with a nerve hook and cauterized with bipolar electrocautery and divided.  This freed  the nerve root and we mobilized it medially.  There was a lateral recess stenosis noted as well and a 2 mm Kerrison was utilized to decompress the lateral recess.  The disk space was then identified.  After electrocautery was utilized to achieve hemostasis in the extensive epidural venous plexus.   Some lipomatosis noted and some redundant fat was removed.  Next, the disk space was identified and the Elma 4 placed upon the disk space and final confirmatory radiograph obtained.  Again, due to the discrepancy in the initial AP and lateral radiographs as far as numbering was concerned, we were again looking for the last open disk space.  A focal disk rupture noted.  Annulotomy was performed and copious portion of the disk material was removed from the disk space.  We then meticulously mobilized from the subannular region with a nerve hook, multiple small and then one large fragment that was just inferior to the disk space behind the vertebral body of S1.  There was a degenerative osteophytic ridge across this as well.  This was further mobilized with Epstein hockey stick curet and a diskectomy was performed with any loose and extruded fragments removed.  The disk space finally was copiously irrigated.  The nerve was fairly mobile following this with excursion medially past 1 cm of the pedicle.  Erythematous and edematous and freely mobile, no evidence of CSF leakage or active bleeding.  Bone wax was placed upon the cancellous surfaces.  Again, the wound was copiously irrigated with inspected with no evidence of CSF leakage or active bleeding. Hockey stick probe placed in foramen L5 S1 found to be widely patent. Thrombin soaked Gelfoam was placed in laminotomy defect.  The McCullough retractor was removed.  Paraspinous muscle was inspected with no evidence of active bleeding.  Prior to this we checked in the axilla of the root beneath the thecal sac and into the foramen without any evidence of residual disk herniation.  Next the dorsal lumbar fascia was reapproximated with 0 Vicryl interrupted figure-of-eight sutures, subcutaneous tissue reapproximated with 2-0 Vicryl simple sutures, skin reapproximated with 4-0 subcuticular Prolene.  Wound reinforced Steri-Strips.  Sterile  dressing applied.  The patient was placed supine on the hospital bed, extubated without difficulty and transported to recovery room satisfactory condition.  The patient tolerated the procedure  well no complications. Dictated by:   Javier Docker, M.D. Attending Physician:  Pierce Crane DD:  02/23/02 TD:  02/24/02 Job: [id number]

## 2011-01-26 NOTE — Op Note (Signed)
Kristine Sims, WADAS NO.:  [id number]   MEDICAL RECORD NO.:  [id number]          PATIENT TYPE:  INP   LOCATION:  9199                          FACILITY:  [facility]   PHYSICIAN:  Lenoard Aden, M.D.DATE OF BIRTH:  [birth year]   DATE OF PROCEDURE:  01/01/2006  DATE OF DISCHARGE:                                 OPERATIVE REPORT   PREOPERATIVE DIAGNOSIS:  Previous cesarean section at 39 weeks, for elective  repeat and tubal ligation.   POSTOPERATIVE DIAGNOSIS:  Previous cesarean section at 39 weeks, for  elective repeat and tubal ligation.   PROCEDURE:  Repeat cesarean section and tubal ligation.   SURGEON:  Lenoard Aden, M.D.   ASSISTANT:  Maxie Better, M.D.   ANESTHESIA:  Spinal.   ANESTHESIOLOGIST:  Germaine Pomfret, M.D.   ESTIMATED BLOOD LOSS:  1000 mL.   COMPLICATIONS:  None.   DRAINS:  Foley.   COUNTS:  Correct.   DISPOSITION:  Patient to Recovery in good condition.   FINDINGS:  1.  Lower uterine segment adhesions.  2.  Small subserosal fibroid.  3.  Normal tubes.  4.  Normal ovaries.  5.  Full-term living female, Apgar scores 8 and 9, pediatricians in      attendance.  6.  Placenta was anterior and delivered manually and intact, sent to L&D.   DESCRIPTION OF PROCEDURE:  After being apprised of the risks of anesthesia,  infection, bleeding, injury to abdominal organs with need for repair,  delayed versus immediate complications to include bowel and bladder  incontinence, failure risk of tubal ligation of 5-10 per 1000, the patient  was brought to the operating room, where she was administered a spinal  anesthesia without complications, prepped and draped in the usual sterile  fashion and Foley catheter placed.  After achieving adequate anesthesia,  dilute Marcaine solution was placed in the area of previous Pfannenstiel and  skin incision made with a scalpel and carried down to the fascia, which was  nicked in the midline and  opened transversely using Mayo scissors.  Rectal  muscle was dissected sharply in the midline, peritoneum entered sharply and  bladder blade placed.  Lower uterine segment adhesions were dissected  sharply and bladder flap was sharply dissected off the lower uterine  segment, Kerr hysterotomy incision made, atraumatic delivery of a full-term  living female, handed to pediatricians in attendance, Apgar scores 8 and 9,  cord blood collected for stem cell collection.  Placenta was delivered  manually intact, 3-vessel cord noted.  Uterus was exteriorized to close in 1  layer using 0 Monocryl suture.  Left tube traced out to the fimbriated end  and ampullary isthmic portion of the tube was identified.  Vascular portion  of the mesosalpinx was cauterized, creating a widow.  Proximal and distal 0  plain ties were placed, tubal segment excised.  Tubal lumens were visualized  and cauterized; good hemostasis was noted.  The same procedure was done on  the right tube and tubal specimen were sent to Pathology for confirmation.  Good hemostasis was noted and good hemostasis  was noted along the lower  uterine incision.  Bladder flap was inspected and found to be hemostatic.  Irrigation was accomplished.  Fascia was then closed using a 0 Monocryl in  continuous-running fashion and skin closed using staples.  The patient  tolerated the procedure well and was transferred to Recovery in good  condition.      Lenoard Aden, M.D.  Electronically Signed     RJT/MEDQ  D:  01/01/2006  T:  01/02/2006  Job:  [id number]

## 2011-01-26 NOTE — Op Note (Signed)
[name], Kristine Sims NO.:  [id number]   MEDICAL RECORD NO.:  [id number]                   PATIENT TYPE:  INP   LOCATION:  9123                                 [facility]ACILITY:  [facility]   PHYSICIAN:  Ronda Fairly. Galen Daft, M.D.              DATE OF BIRTH:  04-01-1971   DATE OF PROCEDURE:  02/27/2003  DATE OF DISCHARGE:                                 OPERATIVE REPORT   PREOPERATIVE DIAGNOSES:  1. Failed induction.  2. Arrest of dilatation.   POSTOPERATIVE DIAGNOSES:  1. Failed induction.  2. Arrest of dilatation.  3. Persistent occiput posterior.   PRINCIPAL PROCEDURE:  Primary low transverse cesarean section.   SURGEON:  Ronda Fairly. Galen Daft, M.D.   COMPLICATIONS:  None.   ESTIMATED BLOOD LOSS:  1000 mL.   ANESTHESIA:  Epidural.   OPERATIVE COURSE:  The patient was informed of the lack of progress.  The  option of continued induction management was discussed and declined.  Cesarean section was then carried out in the following fashion.  Preoperative antibiotics were given, Betadine prep, sterile technique.  A  Pfannenstiel incision was utilized,  the abdomen was entered without  difficulty, and care was taken to cauterize or ligate any vasculature.  The  bladder flap was developed over the visceral peritoneum overlying the uterus  using the bladder blade and it was refracted down on the lower uterine  segment.  A low cervical transverse uterine incision was carried out.  The  baby was in the occiput posterior position.  It was a baby boy.  Apgars 8 at  one minute and 9 at five minutes, weight of 7 pounds 1 ounce.  Prior to  delivery of the body of the baby, after delivery of the head, the  nasopharynx, both sides, and the oropharynx all were suctioned with a DeLee-  type suction cannula.  After delivery of the baby, the cord specimens were  obtained and the umbilical bank specimen also was obtained using the proper  technique according to the bank book.   The uterus was exteriorized after  placental extraction and the uterus was explored.  It was empty.  On the  posterior surface of the uterus there were two fibroids.  One of them was  fairly exophytic, the other one was intramural and exophytic.  The remainder  of the uterus and ovaries was unremarkable anatomically.  The uterus was  closed in two layers.  First with a running locking followed by an  imbricating layer.  There was no active bleeding at the closure of both  layers.  The abdomen was irrigated and suctioned.  The uterus was placed  back into the abdominal cavity and again, inspection was performed on the  incision and all areas intra-abdominally.  There was complete hemostasis  noted.  The muscle was reapproximated at the midline with interrupted  suture.  The fascia was  then closed with 1 Vicryl in a running fashion.  The  fascia was then intact.  All subcutaneous tissues were  hemostatic as the skin was then closed with 3-0 Monocryl.  All instrument,  sponge, and needle counts were correct at the end of the case as they were  throughout the case.  The total estimated blood loss was 1000 mL.  She named  her baby boy Maisie Fus.                                               Ronda Fairly. Galen Daft, M.D.    NJT/MEDQ  D:  02/27/2003  T:  02/27/2003  Job:  [id number]

## 2011-01-26 NOTE — Op Note (Signed)
NAMECHARISE, LEINBACH NO.:  [id number]   MEDICAL RECORD NO.:  [id number]                   PATIENT TYPE:  INP   LOCATION:  0446                                 [facility]ACILITY[facility]  [facility]   PHYSICIAN:  Jene Every, M.D.                 DATE OF BIRTH:  1971/02/01   DATE OF PROCEDURE:  02/29/2004  DATE OF DISCHARGE:                                 OPERATIVE REPORT   PREOPERATIVE DIAGNOSES:  1. Spinal stenosis.  2. Recurrent herniated nucleus pulposus, L5-S1, left.   POSTOPERATIVE DIAGNOSES:  1. Spinal stenosis.  2. Recurrent herniated nucleus pulposus, L5-S1, left.   PROCEDURES PERFORMED:  1. Redo microdiskectomy, L5-S1.  2. Lateral recess decompression, L5-S1.  3. Foraminotomy, S1.  4. Hemilaminotomy of S1.   BRIEF HISTORY AND INDICATIONS:  This is a 39 year old with recurrent left  lower extremity radicular pain, S1 nerve root distribution, recurrent disk  herniation noted at L5-S1.  The patient had severe S1 nerve root  compression.  Operative intervention is indicated for decompression.  Risks  and benefits discussed including bleeding, infection, damage to vascular  structures, CSF leakage, epidural fibrosis, adjacent segment disease, need  for fusion in the future.   TECHNIQUE:  The patient in supine position.  After the induction of adequate  general anesthesia and 1 g of Kefzol, she was placed prone on the [place]  frame.  All bony prominences well-padded.  Lumbar region is prepped and  draped in the usual sterile fashion.  The previous surgical incision was  utilized and incised.  The subcutaneous tissue was dissected.  Electrocautery was utilized to achieve hemostasis.  The dorsolumbar fascia  identified and divided in line with the skin incision.  Paraspinous muscle  elevated from the lamina 5 and S1.  McCullough retractor placed.  Two  radiographs were obtained to obtain the disk space at L5-S1.  There was also  a disk space over a  rudimentary disk at S1, S2, and the lamina of S1 was  fairly beveled and projected into the interspace at 5 and 1.  I felt this  was part of the compressive process.  Expanded the hemilaminotomy in the  caudad edge of 5, skeletonized the medial aspect of the facet with a  curette.  We performed a large hemilaminotomy caudad of the S1 lamina, and  the beveled lamina was approached from caudad to cephalad, and we removed  this portion to gain access to the interlaminar space.  There was  significant shingling at 5-1.  After this was removed, identified the S1  nerve root and traced it proximally.  There was severe compression in the  lateral recess.  We identified the pedicle of S1 and just cephalad to that  and medial to that was the disk space we identified.  There was extruded  fragment noted, and this was retrieved with a pituitary.  The nerve root  was  gently mobilized medially.  We entered the disk space with an annulotomy,  and a copious portion of disk material was removed from the disk space,  mobilized with an Epstein, and further removed with pituitaries, straight  and upbiting.  Performed a foraminotomy of 5 as well.  Ligamentum flavum and  epidural fibrosis was then removed from the lateral recess, decompressing  the S1 root.  There was a spur that was also on the MRI on the posterior of  5.  There was redundant annulus noted, and this was removed.  The spur was  not, however.  We did not want to produce a cancellous surface close to the  nerve root.  After we had performed a thorough diskectomy, we checked  underneath the thecal sac in the foramen of 5 and S1 and the axilla with no  evidence of residual disk herniation compressing the root.  The root was  freely mobile, and we had at least thickness of 1 cm of free excursion  medial to the pedicle.  Disk space copiously irrigated with antibiotic  irrigation.  Thrombin-soaked Gelfoam was placed in the interlaminar disk  space.   Inspection revealed no evidence of CSF leakage or active bleeding.  McCullough retractor was removed.  Paraspinous muscles inspected with no  evidence of active bleeding.  Dorsolumbar fascia reapproximated with #1  Vicryl interrupted figure-of-eight sutures, subcutaneous tissue  reapproximated with 2-0 Vicryl simple sutures.  Skin was reapproximated with  4-0 subcuticular Prolene.  Wound was reinforced with Steri-Strips.  Sterile  dressing applied, placed supine on the hospital bed, extubated without  difficulty, and transported to the recovery room in satisfactory condition.   The patient tolerated the procedure well with no apparent complications.                                               Jene Every, M.D.    Cordelia Pen  D:  02/29/2004  T:  02/29/2004  Job:  [id number]

## 2011-02-14 ENCOUNTER — Ambulatory Visit (INDEPENDENT_AMBULATORY_CARE_PROVIDER_SITE_OTHER): Payer: 59 | Admitting: Family Medicine

## 2011-02-14 ENCOUNTER — Encounter: Payer: Self-pay | Admitting: Family Medicine

## 2011-02-14 VITALS — BP 140/88 | Temp 98.3°F | Wt 180.0 lb

## 2011-02-14 DIAGNOSIS — G43909 Migraine, unspecified, not intractable, without status migrainosus: Secondary | ICD-10-CM

## 2011-02-14 DIAGNOSIS — M542 Cervicalgia: Secondary | ICD-10-CM

## 2011-02-14 MED ORDER — CYCLOBENZAPRINE HCL 10 MG PO TABS: 10.0000 mg | ORAL_TABLET | Freq: Three times a day (TID) | ORAL | Status: DC | PRN

## 2011-02-14 NOTE — Progress Notes (Signed)
  Subjective:    Patient ID: Kristine Sims, female    DOB: 07/31/1971, 40 y.o.   MRN: [id number]  HPI Here for several reasons. First she will be leaving on a cruise with her family soon, and she wants to make sure she takes enough migraine medication with her. Her insurance company will only allow her to get 3 Imitrex pills at a time, and she is afraid this will not be enough. Second for the past 3 weeks she has had what she thinks is a pinched nerve in the left neck. She has a sharp pain here which radiates to the left shoulder and down the left arm. She has some Diclofenac at home but has not tried this.    Review of Systems  Constitutional: Negative.   Musculoskeletal: Positive for arthralgias.  Neurological: Positive for headaches.       Objective:   Physical Exam  Constitutional: She is oriented to person, place, and time. She appears well-developed and well-nourished.  Musculoskeletal:       Tender in the left posterior lower neck, full ROM   Neurological: She is alert and oriented to person, place, and time. No cranial nerve deficit. She exhibits normal muscle tone. Coordination normal.          Assessment & Plan:  Try heat, massages, and Flexeril for the neck pain. Gave her samples of 5 pills of Relpax 40 mg to use prn

## 2011-03-10 ENCOUNTER — Emergency Department (HOSPITAL_BASED_OUTPATIENT_CLINIC_OR_DEPARTMENT_OTHER)
Admission: EM | Admit: 2011-03-10 | Discharge: 2011-03-10 | Disposition: A | Discharge status: Home / Self Care | Payer: 59 | Attending: Emergency Medicine | Admitting: Emergency Medicine

## 2011-03-10 DIAGNOSIS — G43909 Migraine, unspecified, not intractable, without status migrainosus: Secondary | ICD-10-CM | POA: Insufficient documentation

## 2011-03-10 DIAGNOSIS — R112 Nausea with vomiting, unspecified: Secondary | ICD-10-CM | POA: Insufficient documentation

## 2011-03-12 ENCOUNTER — Telehealth: Payer: Self-pay | Admitting: *Deleted

## 2011-03-12 NOTE — Telephone Encounter (Signed)
[facility] Triage Call Report Triage Record Num: [id number] Operator: Tomasita Crumble Patient Name: Kristine Sims Call Date & Time: 03/10/2011 5:18:47PM Patient Phone: [phone] PCP: Tera Mater. Clent Ridges Patient Gender: Female PCP Fax : [phone] Patient DOB: 08-23-1971 Practice Name: [facility] Reason for Call: Pt. calling. States she has migraines by his usually responsive to Imitrex. Woke with headache 6/30/ am. Rates pain at 10/10; unable to wear glasses due to pressure on face. Has vomited; no relief from Phenergan for nausea. New tenderness over temple area. Advised ED per Headache protocol. Caller states plan to discuss with her spouse. Reinforced need to seek immediate care. Protocol(s) Used: Headache Recommended Outcome per Protocol: See Provider within 4 hours Reason for Outcome: New tenderness over temporal area Care Advice: ~ Another adult should drive. ~ Do not give the patient anything to eat or drink. ~ Do not take aspirin for headache until discussing with your provider. Call EMS 911 if any of the following develop: sudden changes in vision including blindness or double vision; new confusion or disorientation; any difficulty speaking, difficulty walking or using an arm or leg, severe numbness on one side of the face, body or any extremities. ~ ~ CAUTIONS Write down provider's name. List or place the following in a bag for transport with the patient: current prescription and/or nonprescription medications; alternative treatments, therapies and medications; and street drugs. ~ 03/10/2011 5:28:14PM Page 1 of 1 [id number]

## 2011-03-13 ENCOUNTER — Other Ambulatory Visit: Payer: Self-pay | Admitting: Obstetrics and Gynecology

## 2011-03-13 DIAGNOSIS — Z1231 Encounter for screening mammogram for malignant neoplasm of breast: Secondary | ICD-10-CM

## 2011-04-24 ENCOUNTER — Ambulatory Visit
Admission: RE | Admit: 2011-04-24 | Discharge: 2011-04-24 | Disposition: A | Discharge status: Home / Self Care | Payer: 59 | Source: Ambulatory Visit | Attending: Obstetrics and Gynecology | Admitting: Obstetrics and Gynecology

## 2011-04-24 DIAGNOSIS — Z1231 Encounter for screening mammogram for malignant neoplasm of breast: Secondary | ICD-10-CM

## 2011-04-25 ENCOUNTER — Other Ambulatory Visit: Payer: Self-pay | Admitting: Obstetrics and Gynecology

## 2011-04-25 DIAGNOSIS — R928 Other abnormal and inconclusive findings on diagnostic imaging of breast: Secondary | ICD-10-CM

## 2011-05-02 ENCOUNTER — Ambulatory Visit
Admission: RE | Admit: 2011-05-02 | Discharge: 2011-05-02 | Disposition: A | Discharge status: Home / Self Care | Payer: 59 | Source: Ambulatory Visit | Attending: Obstetrics and Gynecology | Admitting: Obstetrics and Gynecology

## 2011-05-02 ENCOUNTER — Other Ambulatory Visit: Payer: Self-pay | Admitting: Obstetrics and Gynecology

## 2011-05-02 DIAGNOSIS — R928 Other abnormal and inconclusive findings on diagnostic imaging of breast: Secondary | ICD-10-CM

## 2011-07-02 ENCOUNTER — Other Ambulatory Visit: Payer: Self-pay | Admitting: Family Medicine

## 2011-07-03 NOTE — Telephone Encounter (Signed)
Script sent e-scribe 

## 2011-08-09 ENCOUNTER — Encounter: Payer: Self-pay | Admitting: Family Medicine

## 2011-08-09 ENCOUNTER — Ambulatory Visit (INDEPENDENT_AMBULATORY_CARE_PROVIDER_SITE_OTHER): Payer: 59 | Admitting: Family Medicine

## 2011-08-09 VITALS — BP 128/86 | HR 100 | Temp 98.4°F | Wt 178.0 lb

## 2011-08-09 DIAGNOSIS — J329 Chronic sinusitis, unspecified: Secondary | ICD-10-CM

## 2011-08-09 MED ORDER — AMOXICILLIN-POT CLAVULANATE 875-125 MG PO TABS: 1.0000 | ORAL_TABLET | Freq: Two times a day (BID) | ORAL | Status: AC

## 2011-08-09 NOTE — Progress Notes (Signed)
  Subjective:    Patient ID: Kristine Sims, female    DOB: 19-Feb-1971, 40 y.o.   MRN: [id number]  HPI Here for 2 weeks of sinus pressure, PND, and ST. No cough or fever. On Mucinex D   Review of Systems  Constitutional: Negative.   HENT: Positive for congestion, postnasal drip and sinus pressure.   Eyes: Negative.   Respiratory: Negative.        Objective:   Physical Exam  Constitutional: She appears well-developed and well-nourished.  HENT:  Right Ear: External ear normal.  Left Ear: External ear normal.  Nose: Nose normal.  Mouth/Throat: Oropharynx is clear and moist. No oropharyngeal exudate.  Eyes: Conjunctivae are normal.  Pulmonary/Chest: Effort normal and breath sounds normal.          Assessment & Plan:  Recheck prn

## 2011-09-07 ENCOUNTER — Encounter: Payer: Self-pay | Admitting: Family Medicine

## 2011-09-07 ENCOUNTER — Ambulatory Visit (INDEPENDENT_AMBULATORY_CARE_PROVIDER_SITE_OTHER): Payer: 59 | Admitting: Family Medicine

## 2011-09-07 DIAGNOSIS — J45901 Unspecified asthma with (acute) exacerbation: Secondary | ICD-10-CM

## 2011-09-07 DIAGNOSIS — J309 Allergic rhinitis, unspecified: Secondary | ICD-10-CM

## 2011-09-07 DIAGNOSIS — J45909 Unspecified asthma, uncomplicated: Secondary | ICD-10-CM

## 2011-09-07 DIAGNOSIS — J069 Acute upper respiratory infection, unspecified: Secondary | ICD-10-CM

## 2011-09-07 HISTORY — DX: Unspecified asthma with (acute) exacerbation: J45.901

## 2011-09-07 MED ORDER — PREDNISONE 20 MG PO TABS: ORAL_TABLET | ORAL | Status: DC

## 2011-09-07 MED ORDER — HYDROCODONE-HOMATROPINE 5-1.5 MG/5ML PO SYRP: ORAL_SOLUTION | ORAL | Status: DC

## 2011-09-07 NOTE — Progress Notes (Signed)
  Subjective:    Patient ID: Kristine Sims, female    DOB: Apr 07, 1971, 40 y.o.   MRN: [id number]  HPI Kristine Sims is a 40 year old, married female, nonsmoker, who comes in today with a 3-day history of head congestion, left earache, cough, and wheezing.  She had a history of allergic rhinitis in the past.  No asthma.  Two of her children.......... 58-year-old and 41-year-old have had colds in the past two weeks..........  She has no fever, otherwise, been well.    Review of Systems    General pulmonary venous systems otherwise negative Objective:   Physical Exam  Well-developed well-nourished, female, in no acute distress.  HEENT negative.  The neck was supple.  No adenopathy.  Lungs were clear except for mild bilateral symmetrical expiratory wheezing      Assessment & Plan:  Viral syndrome with secondary asthma.  Plan drink lots of liquids, Hydromet for cough burst and taper of prednisone.  Return p.r.n.

## 2011-09-07 NOTE — Patient Instructions (Signed)
Drink lots of water.  One a vaporizer or humidifier in her bedroom at night.  Beginning the prednisone as directed,,,,,,,,,, avoid salt.  Hydromet one half to 1 teaspoon up to 3 times daily as needed for cough and cold.  You can also use one shot of afrin nasal spray up each nostril at bedtime for 5 nights

## 2011-09-21 ENCOUNTER — Other Ambulatory Visit: Payer: Self-pay | Admitting: Family Medicine

## 2011-09-21 DIAGNOSIS — J069 Acute upper respiratory infection, unspecified: Secondary | ICD-10-CM

## 2011-09-21 MED ORDER — HYDROCODONE-HOMATROPINE 5-1.5 MG/5ML PO SYRP: ORAL_SOLUTION | ORAL | Status: DC

## 2011-09-21 NOTE — Telephone Encounter (Signed)
Pt was in to see Dr Tawanna Cooler on 09/07/11 re: cough and chest congestion. Pt still has cough, especially at night and is req refill of HYDROcodone-homatropine (HYCODAN) 5-1.5 MG/5ML syrup to [facility].

## 2011-09-21 NOTE — Telephone Encounter (Signed)
rx called in

## 2011-09-21 NOTE — Telephone Encounter (Signed)
Call in another 240 ml bottle with no rf 

## 2011-09-24 ENCOUNTER — Other Ambulatory Visit: Payer: Self-pay | Admitting: Obstetrics and Gynecology

## 2011-09-24 DIAGNOSIS — N63 Unspecified lump in unspecified breast: Secondary | ICD-10-CM

## 2011-10-19 ENCOUNTER — Ambulatory Visit
Admission: RE | Admit: 2011-10-19 | Discharge: 2011-10-19 | Disposition: A | Discharge status: Home / Self Care | Payer: 59 | Source: Ambulatory Visit | Attending: Obstetrics and Gynecology | Admitting: Obstetrics and Gynecology

## 2011-10-19 DIAGNOSIS — N63 Unspecified lump in unspecified breast: Secondary | ICD-10-CM

## 2011-11-21 ENCOUNTER — Encounter: Payer: Self-pay | Admitting: Family Medicine

## 2011-11-21 ENCOUNTER — Other Ambulatory Visit: Payer: Self-pay | Admitting: Family Medicine

## 2011-11-21 ENCOUNTER — Ambulatory Visit (INDEPENDENT_AMBULATORY_CARE_PROVIDER_SITE_OTHER): Payer: 59 | Admitting: Family Medicine

## 2011-11-21 VITALS — BP 120/82 | HR 99 | Temp 99.0°F | Wt 176.0 lb

## 2011-11-21 DIAGNOSIS — J069 Acute upper respiratory infection, unspecified: Secondary | ICD-10-CM

## 2011-11-21 MED ORDER — HYDROCODONE-HOMATROPINE 5-1.5 MG/5ML PO SYRP: 5.0000 mL | ORAL_SOLUTION | ORAL | Status: DC | PRN

## 2011-11-21 NOTE — Progress Notes (Signed)
  Subjective:    Patient ID: Kristine Sims, female    DOB: 03/27/1971, 41 y.o.   MRN: [id number]  HPI Here for 5 days of itchy eyes, ST, fatigue, and a dry cough. No fever or NVD.    Review of Systems  Constitutional: Negative.   HENT: Positive for congestion, sore throat, postnasal drip and sinus pressure.   Eyes: Negative.   Respiratory: Positive for cough.        Objective:   Physical Exam  Constitutional: She appears well-developed and well-nourished.  HENT:  Right Ear: External ear normal.  Left Ear: External ear normal.  Nose: Nose normal.  Mouth/Throat: No oropharyngeal exudate.       Posterior OP is red and the uvula is swollen   Eyes: Conjunctivae are normal.  Pulmonary/Chest: Effort normal and breath sounds normal.  Lymphadenopathy:    She has no cervical adenopathy.          Assessment & Plan:  viral URI. Rest, drink fluids, try Advil

## 2011-11-27 ENCOUNTER — Telehealth: Payer: Self-pay | Admitting: Family Medicine

## 2011-11-27 MED ORDER — HYDROCODONE-HOMATROPINE 5-1.5 MG/5ML PO SYRP: 5.0000 mL | ORAL_SOLUTION | ORAL | Status: AC | PRN

## 2011-11-27 NOTE — Telephone Encounter (Signed)
Call in another bottle of Hydromet 

## 2011-11-27 NOTE — Telephone Encounter (Signed)
Patient called requesting a refill on her cough meds. Please assist.

## 2011-11-27 NOTE — Telephone Encounter (Signed)
Addended by: Aniceto Boss A on: 11/27/2011 03:45 PM   Modules accepted: Orders

## 2011-11-27 NOTE — Telephone Encounter (Signed)
Script called in

## 2011-12-06 ENCOUNTER — Other Ambulatory Visit: Payer: Self-pay | Admitting: Family Medicine

## 2012-01-03 ENCOUNTER — Other Ambulatory Visit: Payer: Self-pay | Admitting: Family Medicine

## 2012-01-04 NOTE — Telephone Encounter (Signed)
Last seen 11/21/11 URI  I dont see where this has been rx'd by you - is only in as historical med Please advise

## 2012-01-09 ENCOUNTER — Telehealth: Payer: Self-pay | Admitting: Family Medicine

## 2012-01-09 MED ORDER — SUMATRIPTAN SUCCINATE 100 MG PO TABS: 100.0000 mg | ORAL_TABLET | ORAL | Status: DC | PRN

## 2012-01-09 NOTE — Telephone Encounter (Signed)
Request for Sumatriptan 100 mg and send in for # 9 with refills due to insurance will cover this. I put in new order and sent script e-scribe.

## 2012-01-14 ENCOUNTER — Telehealth: Payer: Self-pay | Admitting: Family Medicine

## 2012-01-14 NOTE — Telephone Encounter (Signed)
Please send in a new rx for #8 with 11 rf

## 2012-01-14 NOTE — Telephone Encounter (Signed)
Patient's Sumatriptan, 12 qty denied. Pt can only get 8 pills/month unless documented >4 migraines/month. I only saw in chart that she has 1 per month, so she will only be able to get 8 pills. Please resend for approved qty. Thanks!

## 2012-01-15 MED ORDER — SUMATRIPTAN SUCCINATE 100 MG PO TABS: 100.0000 mg | ORAL_TABLET | ORAL | Status: DC | PRN

## 2012-01-15 NOTE — Telephone Encounter (Signed)
I sent script e-scribe. 

## 2012-03-03 ENCOUNTER — Telehealth: Payer: Self-pay | Admitting: Family Medicine

## 2012-03-03 NOTE — Telephone Encounter (Signed)
Patient called stating that she need a refill of hydromet [facility] [address]. Please assist.

## 2012-03-04 ENCOUNTER — Encounter: Payer: Self-pay | Admitting: Family Medicine

## 2012-03-04 ENCOUNTER — Ambulatory Visit (INDEPENDENT_AMBULATORY_CARE_PROVIDER_SITE_OTHER): Payer: BC Managed Care – PPO | Admitting: Family Medicine

## 2012-03-04 VITALS — BP 118/76 | HR 119 | Temp 98.5°F | Wt 156.0 lb

## 2012-03-04 DIAGNOSIS — Z9109 Other allergy status, other than to drugs and biological substances: Secondary | ICD-10-CM

## 2012-03-04 DIAGNOSIS — J309 Allergic rhinitis, unspecified: Secondary | ICD-10-CM

## 2012-03-04 MED ORDER — HYDROCODONE-HOMATROPINE 5-1.5 MG/5ML PO SYRP: 5.0000 mL | ORAL_SOLUTION | ORAL | Status: DC | PRN

## 2012-03-04 MED ORDER — METHYLPREDNISOLONE ACETATE 80 MG/ML IJ SUSP
120.0000 mg | Freq: Once | INTRAMUSCULAR | Status: AC
  Administered 2012-03-04: 120 mg via INTRAMUSCULAR

## 2012-03-04 NOTE — Addendum Note (Signed)
Addended by: Aniceto Boss A on: 03/04/2012 12:11 PM   Modules accepted: Orders

## 2012-03-04 NOTE — Telephone Encounter (Signed)
Can you call pt and offer a office visit? 

## 2012-03-04 NOTE — Progress Notes (Signed)
  Subjective:    Patient ID: Kristine Sims, female    DOB: September 18, 1970, 41 y.o.   MRN: [id number]  HPI Here for 4 days of itchy eyes, PND, ST, ears popping, and a dry cough. No fever. On Advil Cold and Sinus and her usual Flonase sprays.    Review of Systems  Constitutional: Negative.   HENT: Positive for ear pain, congestion, rhinorrhea, sneezing, postnasal drip and sinus pressure. Negative for hearing loss.   Eyes: Positive for itching. Negative for redness.  Respiratory: Positive for cough.        Objective:   Physical Exam  Constitutional: She appears well-developed and well-nourished.  HENT:  Right Ear: External ear normal.  Left Ear: External ear normal.  Nose: Nose normal.  Mouth/Throat: Oropharynx is clear and moist. No oropharyngeal exudate.  Eyes: Conjunctivae are normal.  Neck: No thyromegaly present.  Pulmonary/Chest: Effort normal and breath sounds normal.  Lymphadenopathy:    She has no cervical adenopathy.          Assessment & Plan:  Recheck prn

## 2012-03-04 NOTE — Telephone Encounter (Signed)
She needs an OV for this  

## 2012-06-03 ENCOUNTER — Other Ambulatory Visit: Payer: Self-pay | Admitting: Family Medicine

## 2012-07-18 ENCOUNTER — Encounter: Payer: Self-pay | Admitting: Family Medicine

## 2012-07-18 ENCOUNTER — Ambulatory Visit (INDEPENDENT_AMBULATORY_CARE_PROVIDER_SITE_OTHER): Payer: BC Managed Care – PPO | Admitting: Family Medicine

## 2012-07-18 VITALS — BP 132/86 | HR 105 | Temp 99.0°F | Wt 143.0 lb

## 2012-07-18 DIAGNOSIS — J329 Chronic sinusitis, unspecified: Secondary | ICD-10-CM

## 2012-07-18 MED ORDER — HYDROCODONE-HOMATROPINE 5-1.5 MG/5ML PO SYRP: 5.0000 mL | ORAL_SOLUTION | ORAL | Status: DC | PRN

## 2012-07-18 MED ORDER — AZITHROMYCIN 250 MG PO TABS: ORAL_TABLET | ORAL | Status: DC

## 2012-07-18 NOTE — Progress Notes (Signed)
  Subjective:    Patient ID: Kristine Sims, female    DOB: 1971-04-27, 41 y.o.   MRN: [id number]  HPI Here for one week of sinus pressure, PND, ST, and a d dry cough. No fever.    Review of Systems  Constitutional: Negative.   HENT: Positive for congestion, postnasal drip and sinus pressure.   Eyes: Negative.   Respiratory: Positive for cough.        Objective:   Physical Exam  Constitutional: She appears well-developed and well-nourished.  HENT:  Right Ear: External ear normal.  Left Ear: External ear normal.  Nose: Nose normal.  Mouth/Throat: Oropharynx is clear and moist.  Eyes: Conjunctivae normal are normal.  Pulmonary/Chest: Effort normal and breath sounds normal.  Lymphadenopathy:    She has no cervical adenopathy.          Assessment & Plan:  Add Mucinex

## 2012-07-23 ENCOUNTER — Other Ambulatory Visit: Payer: Self-pay | Admitting: Family Medicine

## 2012-07-24 ENCOUNTER — Encounter: Payer: Self-pay | Admitting: Family Medicine

## 2012-07-24 MED ORDER — HYDROCODONE-HOMATROPINE 5-1.5 MG/5ML PO SYRP: 5.0000 mL | ORAL_SOLUTION | ORAL | Status: DC | PRN

## 2012-07-24 NOTE — Telephone Encounter (Signed)
Per Dr. Clent Ridges, okay to refill Hydromet syrup. I did call in to pharmacy and spoke with pt.

## 2012-07-25 NOTE — Telephone Encounter (Signed)
The script is ready, per pharmacy and I left voice message for pt.

## 2012-09-02 ENCOUNTER — Ambulatory Visit (INDEPENDENT_AMBULATORY_CARE_PROVIDER_SITE_OTHER): Payer: BC Managed Care – PPO | Admitting: Family

## 2012-09-02 ENCOUNTER — Encounter: Payer: Self-pay | Admitting: Family

## 2012-09-02 VITALS — BP 124/80

## 2012-09-02 DIAGNOSIS — M5412 Radiculopathy, cervical region: Secondary | ICD-10-CM

## 2012-09-02 DIAGNOSIS — M25511 Pain in right shoulder: Secondary | ICD-10-CM

## 2012-09-02 DIAGNOSIS — M25519 Pain in unspecified shoulder: Secondary | ICD-10-CM

## 2012-09-02 MED ORDER — CYCLOBENZAPRINE HCL 10 MG PO TABS: 10.0000 mg | ORAL_TABLET | Freq: Three times a day (TID) | ORAL | Status: DC | PRN

## 2012-09-02 MED ORDER — PREDNISONE 20 MG PO TABS: ORAL_TABLET | ORAL | Status: DC

## 2012-09-02 MED ORDER — KETOROLAC TROMETHAMINE 60 MG/2ML IM SOLN
60.0000 mg | Freq: Once | INTRAMUSCULAR | Status: AC
  Administered 2012-09-02: 60 mg via INTRAMUSCULAR

## 2012-09-02 NOTE — Patient Instructions (Signed)
Cervical Radiculopathy  Cervical radiculopathy happens when a nerve in the neck is pinched or bruised by a slipped (herniated) disk or by arthritic changes in the bones of the cervical spine. This can occur due to an injury or as part of the normal aging process. Pressure on the cervical nerves can cause pain or numbness that runs from your neck all the way down into your arm and fingers.  CAUSES   There are many possible causes, including:   Injury.   Muscle tightness in the neck from overuse.   Swollen, painful joints (arthritis).   Breakdown or degeneration in the bones and joints of the spine (spondylosis) due to aging.   Bone spurs that may develop near the cervical nerves.  SYMPTOMS   Symptoms include pain, weakness, or numbness in the affected arm and hand. Pain can be severe or irritating. Symptoms may be worse when extending or turning the neck.  DIAGNOSIS   Your caregiver will ask about your symptoms and do a physical exam. He or she may test your strength and reflexes. X-rays, CT scans, and MRI scans may be needed in cases of injury or if the symptoms do not go away after a period of time. Electromyography (EMG) or nerve conduction testing may be done to study how your nerves and muscles are working.  TREATMENT   Your caregiver may recommend certain exercises to help relieve your symptoms. Cervical radiculopathy can, and often does, get better with time and treatment. If your problems continue, treatment options may include:   Wearing a soft collar for short periods of time.   Physical therapy to strengthen the neck muscles.   Medicines, such as nonsteroidal anti-inflammatory drugs (NSAIDs), oral corticosteroids, or spinal injections.   Surgery. Different types of surgery may be done depending on the cause of your problems.  HOME CARE INSTRUCTIONS    Put ice on the affected area.   Put ice in a plastic bag.   Place a towel between your skin and the bag.   Leave the ice on for 15 to 20  minutes, 3 to 4 times a day or as directed by your caregiver.   If ice does not help, you can try using heat. Take a warm shower or bath, or use a hot water bottle as directed by your caregiver.   You may try a gentle neck and shoulder massage.   Use a flat pillow when you sleep.   Only take over-the-counter or prescription medicines for pain, discomfort, or fever as directed by your caregiver.   If physical therapy was prescribed, follow your caregiver's directions.   If a soft collar was prescribed, use it as directed.  SEEK IMMEDIATE MEDICAL CARE IF:    Your pain gets much worse and cannot be controlled with medicines.   You have weakness or numbness in your hand, arm, face, or leg.   You have a high fever or a stiff, rigid neck.   You lose bowel or bladder control (incontinence).   You have trouble with walking, balance, or speaking.  MAKE SURE YOU:    Understand these instructions.   Will watch your condition.   Will get help right away if you are not doing well or get worse.  Document Released: 05/22/2001 Document Revised: 11/19/2011 Document Reviewed: 04/10/2011  Exit[employer] Patient Information 2013 [employer].

## 2012-09-02 NOTE — Progress Notes (Signed)
Subjective:    Patient ID: Kristine Sims, female    DOB: November 09, 1970, 41 y.o.   MRN: [id number]  HPI 41 year old white female, nonsmoker, patient of Dr. Clent Ridges is in today with complaints of neck and right shoulder pain x2 days. She has a history of a ruptured disc in the C-spine did to a fall down a flight of steps several years ago. She also worked as a Physiological scientist. She had been seeing the pain clinic for treatment of her chronic neck pain recently stopped seeing him with her symptoms are better. She has not seen the pain clinic in 2 months. Denies any new injury or trauma. Describes the pain as constant, rates it 8/10. Better with standing and worse with lying.   Review of Systems  Constitutional: Negative.   Respiratory: Negative.   Cardiovascular: Negative.   Gastrointestinal: Negative.   Musculoskeletal: Positive for arthralgias.       Right Shoulder pain   Skin: Negative.   Neurological: Negative.   Hematological: Negative.    Past Medical History  Diagnosis Date  . Peripheral neuropathy     per Dr. Dian Situ and Dr. Vear Clock  . Acute recurrent maxillary sinusitis     frequent, sees Dr. Narda Bonds  . Migraines   . Allergy     History   Social History  . Marital Status: Married    Spouse Name: N/A    Number of Children: N/A  . Years of Education: N/A   Occupational History  . Not on file.   Social History Main Topics  . Smoking status: Never Smoker   . Smokeless tobacco: Never Used  . Alcohol Use: Yes     Comment: once a month  . Drug Use: No  . Sexually Active: Not on file   Other Topics Concern  . Not on file   Social History Narrative  . No narrative on file    Past Surgical History  Procedure Date  . Lumbar disc surgery   . Nasal septum surgery     Family History  Problem Relation Age of Onset  . Hypertension    . Heart disease      Allergies  Allergen Reactions  . Oxycodone-Acetaminophen     Current Outpatient Prescriptions  on File Prior to Visit  Medication Sig Dispense Refill  . azithromycin (ZITHROMAX) 250 MG tablet As directed  6 tablet  0  . fluticasone (FLONASE) 50 MCG/ACT nasal spray 2 sprays by Nasal route daily.        Marland Kitchen HYDROcodone-acetaminophen (NORCO) 5-325 MG per tablet Take 1 tablet by mouth every 6 (six) hours as needed.        . promethazine (PHENERGAN) 25 MG tablet TAKE 1 TABLET EVERY 4 HOURS AS NEEDED FOR NAUSEA  30 tablet  1  . SUMAtriptan (IMITREX) 100 MG tablet Take 1 tablet (100 mg total) by mouth every 2 (two) hours as needed for migraine.  8 tablet  11    BP 124/80chart     Objective:   Physical Exam  Constitutional: She is oriented to person, place, and time. She appears well-developed and well-nourished.  HENT:  Right Ear: External ear normal.  Left Ear: External ear normal.  Nose: Nose normal.  Mouth/Throat: Oropharynx is clear and moist.  Neck: Normal range of motion. Neck supple.  Cardiovascular: Normal rate, regular rhythm and normal heart sounds.   Pulmonary/Chest: Effort normal and breath sounds normal.  Abdominal: Soft. Bowel sounds are normal.  Musculoskeletal:  She exhibits no edema and no tenderness.       C-spine: Pain with rotation left and right. No plan with flexion or extension. No tenderness to palpation.  Neurological: She is alert and oriented to person, place, and time. She has normal reflexes.  Skin: Skin is warm and dry.  Psychiatric: She has a normal mood and affect.          Assessment & Plan:   Assessment: Cervical radiculopathy, right shoulder pain  Plan: Toradol 60 mg IM x1 given. Flexeril 10 mg 3 times a day. Prednisone 60x3, 40x3, 20x3. We'll consider a CT scan or MRI of the C-spine her symptoms persist. Recheck a schedule, and as needed.

## 2012-09-12 ENCOUNTER — Telehealth: Payer: Self-pay | Admitting: Family Medicine

## 2012-09-12 ENCOUNTER — Encounter: Payer: Self-pay | Admitting: Family Medicine

## 2012-09-12 MED ORDER — PROMETHAZINE HCL 25 MG PO TABS: 25.0000 mg | ORAL_TABLET | ORAL | Status: DC | PRN

## 2012-09-12 NOTE — Telephone Encounter (Signed)
done

## 2012-09-25 ENCOUNTER — Other Ambulatory Visit: Payer: Self-pay | Admitting: Orthopedic Surgery

## 2012-09-25 DIAGNOSIS — M542 Cervicalgia: Secondary | ICD-10-CM

## 2012-09-26 ENCOUNTER — Ambulatory Visit
Admission: RE | Admit: 2012-09-26 | Discharge: 2012-09-26 | Disposition: A | Discharge status: Home / Self Care | Payer: BC Managed Care – PPO | Source: Ambulatory Visit | Attending: Orthopedic Surgery | Admitting: Orthopedic Surgery

## 2012-09-26 DIAGNOSIS — M542 Cervicalgia: Secondary | ICD-10-CM

## 2012-10-20 ENCOUNTER — Ambulatory Visit (INDEPENDENT_AMBULATORY_CARE_PROVIDER_SITE_OTHER): Payer: BC Managed Care – PPO | Admitting: Family Medicine

## 2012-10-20 ENCOUNTER — Encounter: Payer: Self-pay | Admitting: Family Medicine

## 2012-10-20 VITALS — BP 130/82 | HR 101 | Temp 98.5°F | Wt 144.0 lb

## 2012-10-20 DIAGNOSIS — J019 Acute sinusitis, unspecified: Secondary | ICD-10-CM

## 2012-10-20 DIAGNOSIS — I83893 Varicose veins of bilateral lower extremities with other complications: Secondary | ICD-10-CM

## 2012-10-20 MED ORDER — AZITHROMYCIN 250 MG PO TABS: ORAL_TABLET | ORAL | Status: DC

## 2012-10-20 MED ORDER — HYDROCODONE-HOMATROPINE 5-1.5 MG/5ML PO SYRP: 5.0000 mL | ORAL_SOLUTION | ORAL | Status: AC | PRN

## 2012-10-21 ENCOUNTER — Encounter: Payer: Self-pay | Admitting: Family Medicine

## 2012-10-21 NOTE — Progress Notes (Signed)
  Subjective:    Patient ID: Kristine Sims, female    DOB: Aug 02, 1971, 42 y.o.   MRN: [id number]  HPI Here for 3 weeks of sinus pressure, PND, ST, and coughing up yellow sputum. No fever. Of note she has also been seeing Dr. 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 These swell and become quite painful. She has been on Diclofenac for inflammation and she has been wearing compression hose, but so far this has not helped much.    Review of Systems  Constitutional: Negative.   HENT: Positive for congestion and postnasal drip.   Eyes: Negative.   Respiratory: Positive for cough.   Cardiovascular: Positive for leg swelling.       Objective:   Physical Exam  Constitutional: She appears well-developed and well-nourished.  HENT:  Right Ear: External ear normal.  Left Ear: External ear normal.  Nose: Nose normal.  Mouth/Throat: Oropharynx is clear and moist.  Eyes: Conjunctivae are normal.  Pulmonary/Chest: Effort normal and breath sounds normal.  Musculoskeletal:  Both lower legs show varicosities which are tender  Lymphadenopathy:    She has no cervical adenopathy.          Assessment & Plan:  Treat the sinusitis with a Zpack and Mucinex. Follow up with Dr. Jimmie Molly.

## 2012-10-25 ENCOUNTER — Other Ambulatory Visit: Payer: Self-pay

## 2012-10-28 ENCOUNTER — Encounter: Payer: Self-pay | Admitting: Family Medicine

## 2012-12-13 ENCOUNTER — Other Ambulatory Visit: Payer: Self-pay | Admitting: Family Medicine

## 2013-03-02 ENCOUNTER — Other Ambulatory Visit: Payer: Self-pay | Admitting: Family Medicine

## 2013-03-16 ENCOUNTER — Encounter: Payer: Self-pay | Admitting: Internal Medicine

## 2013-03-17 ENCOUNTER — Encounter: Payer: Self-pay | Admitting: Family Medicine

## 2013-03-17 ENCOUNTER — Ambulatory Visit (INDEPENDENT_AMBULATORY_CARE_PROVIDER_SITE_OTHER): Payer: BC Managed Care – PPO | Admitting: Family Medicine

## 2013-03-17 VITALS — BP 136/84 | HR 118 | Temp 98.7°F | Wt 128.0 lb

## 2013-03-17 DIAGNOSIS — J029 Acute pharyngitis, unspecified: Secondary | ICD-10-CM

## 2013-03-17 LAB — POCT RAPID STREP A (OFFICE): Rapid Strep A Screen: NEGATIVE

## 2013-03-17 MED ORDER — METHYLPREDNISOLONE ACETATE 80 MG/ML IJ SUSP
120.0000 mg | Freq: Once | INTRAMUSCULAR | Status: AC
  Administered 2013-03-17: 120 mg via INTRAMUSCULAR

## 2013-03-17 MED ORDER — LIDOCAINE VISCOUS 2 % MT SOLN: 5.0000 mL | OROMUCOSAL | Status: DC | PRN

## 2013-03-17 NOTE — Progress Notes (Signed)
  Subjective:    Patient ID: Kristine Sims, female    DOB: 1970/11/08, 42 y.o.   MRN: [id number]  HPI Here for 5 days of a severe ST with swollen neck nodes and difficulty swallowing. No fever or PND. Some occasional dry coughing. Her son and husband had URI's last week which resolved. Taking Advil.    Review of Systems  Constitutional: Negative.   HENT: Positive for sore throat and trouble swallowing. Negative for voice change.   Eyes: Negative.   Respiratory: Positive for cough. Negative for shortness of breath.        Objective:   Physical Exam  Constitutional: She appears well-developed and well-nourished.  HENT:  Right Ear: External ear normal.  Left Ear: External ear normal.  Nose: Nose normal.  Mouth/Throat: No oropharyngeal exudate.  Posterior OP is bright red without exudate, no breath odor   Eyes: Conjunctivae are normal.  Neck: No thyromegaly present.  Multiple enlarged tender anterior neck nodes  Pulmonary/Chest: Effort normal and breath sounds normal.  Skin: No rash noted.          Assessment & Plan:  This is a viral URI, most likely hand, foot, and mouth disease. Given a steroid shot to reduce throat inflammation. Use Advil prn. Try viscous lidocaine before meals.

## 2013-03-17 NOTE — Addendum Note (Signed)
Addended by: Aniceto Boss A on: 03/17/2013 01:03 PM   Modules accepted: Orders

## 2013-03-18 ENCOUNTER — Encounter: Payer: Self-pay | Admitting: Family Medicine

## 2013-03-30 ENCOUNTER — Ambulatory Visit: Payer: BC Managed Care – PPO | Admitting: Internal Medicine

## 2013-05-29 ENCOUNTER — Other Ambulatory Visit: Payer: Self-pay | Admitting: Family Medicine

## 2013-07-01 ENCOUNTER — Other Ambulatory Visit: Payer: Self-pay

## 2013-07-01 DIAGNOSIS — Z1231 Encounter for screening mammogram for malignant neoplasm of breast: Secondary | ICD-10-CM

## 2013-07-16 ENCOUNTER — Other Ambulatory Visit: Payer: Self-pay

## 2013-07-22 ENCOUNTER — Ambulatory Visit
Admission: RE | Admit: 2013-07-22 | Discharge: 2013-07-22 | Disposition: A | Discharge status: Home / Self Care | Payer: PRIVATE HEALTH INSURANCE | Source: Ambulatory Visit

## 2013-07-22 DIAGNOSIS — Z1231 Encounter for screening mammogram for malignant neoplasm of breast: Secondary | ICD-10-CM

## 2013-07-27 ENCOUNTER — Ambulatory Visit (INDEPENDENT_AMBULATORY_CARE_PROVIDER_SITE_OTHER): Payer: BC Managed Care – PPO | Admitting: Family Medicine

## 2013-07-27 ENCOUNTER — Encounter: Payer: Self-pay | Admitting: Family Medicine

## 2013-07-27 VITALS — BP 126/80 | Temp 98.8°F | Wt 141.0 lb

## 2013-07-27 DIAGNOSIS — J019 Acute sinusitis, unspecified: Secondary | ICD-10-CM

## 2013-07-27 MED ORDER — AZITHROMYCIN 250 MG PO TABS: ORAL_TABLET | ORAL | Status: DC

## 2013-07-27 MED ORDER — METHYLPREDNISOLONE ACETATE 80 MG/ML IJ SUSP
120.0000 mg | Freq: Once | INTRAMUSCULAR | Status: AC
  Administered 2013-07-27: 120 mg via INTRAMUSCULAR

## 2013-07-27 NOTE — Progress Notes (Signed)
  Subjective:    Patient ID: Kristine Sims, female    DOB: October 23, 1970, 42 y.o.   MRN: [id number]  HPI Here for 2 weeks of sinus pressure, PND, and coughing up yellow sputum. No fever.    Review of Systems  Constitutional: Negative.   HENT: Positive for congestion, postnasal drip and sinus pressure.   Eyes: Negative.   Respiratory: Positive for cough.        Objective:   Physical Exam  Constitutional: She appears well-developed and well-nourished.  HENT:  Right Ear: External ear normal.  Left Ear: External ear normal.  Nose: Nose normal.  Mouth/Throat: Oropharynx is clear and moist.  Eyes: Conjunctivae are normal.  Pulmonary/Chest: Effort normal and breath sounds normal.  Lymphadenopathy:    She has no cervical adenopathy.          Assessment & Plan:  Add Mucinex.

## 2013-07-27 NOTE — Addendum Note (Signed)
Addended by: Aniceto Boss A on: 07/27/2013 02:25 PM   Modules accepted: Orders

## 2013-07-27 NOTE — Progress Notes (Signed)
Pre visit review using our clinic review tool, if applicable. No additional management support is needed unless otherwise documented below in the visit note. 

## 2013-08-18 ENCOUNTER — Encounter: Payer: Self-pay | Admitting: Family Medicine

## 2013-08-18 ENCOUNTER — Telehealth: Payer: Self-pay | Admitting: Family Medicine

## 2013-08-18 NOTE — Telephone Encounter (Signed)
Patient Information:  Caller Name: Maame  Phone: [phone]  Patient: Kristine Sims, Kristine Sims  Gender: Female  DOB: Sep 18, 1970  Age: 42 Years  PCP: Gershon Crane [facility])  Pregnant: No  Office Follow Up:  Does the office need to follow up with this patient?: No  Instructions For The Office: N/A   Symptoms  Reason For Call & Symptoms: Pt calling that there is a "stomach bug" going around in her family.  She awoke at 0100 this am and now is just vomiting bile. Since 0100 she has vomited x 5 with no diarrhea.  Last voided at 0800 and concentrated.  Reviewed Health History In EMR: Yes  Reviewed Medications In EMR: Yes  Reviewed Allergies In EMR: Yes  Reviewed Surgeries / Procedures: Yes  Date of Onset of Symptoms: 08/18/2013  Treatments Tried: Tried Phenergan 25 mg  at 0900 and vomited it back up.  Treatments Tried Worked: No OB / GYN:  LMP: Unknown  Guideline(s) Used:  Vomiting  Disposition Per Guideline:   Go to ED Now  Reason For Disposition Reached:   Vomiting contains bile (green color)  Advice Given:  N/A  Patient Will Follow Care Advice:  YES  She will go to a hospital in [place].

## 2013-08-18 NOTE — Telephone Encounter (Signed)
Pt spoke with call a nurse and was told to go to ER and pt agreed to follow the advise given.

## 2013-08-18 NOTE — Telephone Encounter (Signed)
Pt spoke with call a nurse and was told to go to ER and pt agreed to follow advise given.

## 2013-08-18 NOTE — Telephone Encounter (Signed)
FYI only.

## 2013-11-18 ENCOUNTER — Telehealth: Payer: Self-pay | Admitting: Family Medicine

## 2013-11-18 NOTE — Telephone Encounter (Signed)
error 

## 2013-11-19 ENCOUNTER — Telehealth: Payer: Self-pay | Admitting: Family Medicine

## 2013-11-19 NOTE — Telephone Encounter (Signed)
[id number]

## 2013-11-20 ENCOUNTER — Telehealth: Payer: Self-pay | Admitting: Family Medicine

## 2013-11-20 MED ORDER — SUMATRIPTAN SUCCINATE 100 MG PO TABS: ORAL_TABLET | ORAL | Status: DC

## 2013-11-20 MED ORDER — PROMETHAZINE HCL 25 MG RE SUPP: 25.0000 mg | Freq: Four times a day (QID) | RECTAL | Status: DC | PRN

## 2013-11-20 NOTE — Telephone Encounter (Signed)
I sent both scripts e-scribe and spoke with pt. I did let her know that we would look into what happened with the triage call not being forwarded to the provider.

## 2013-11-20 NOTE — Telephone Encounter (Signed)
I spoke with pt and she is requesting a script for phenergan suppository & imitrex. Pt stated that if she needs to schedule a office visit she will. She did speak with a triage nurse on 11/19/13, however there is no documentation.

## 2013-11-20 NOTE — Telephone Encounter (Signed)
Call in Imitrex 100 mg #12 with 11 rf, also Phenergan 25 mg suppositories #30 with 11 rf

## 2013-11-20 NOTE — Telephone Encounter (Signed)
Caller: Makhya/Patient; Phone: [phone]; Reason for Call: Patient called insisting to speak to someone "on site"; she spoke to triage nurse on 3/12.  Patient believed the office would call her yesterday.  Because she did not hear from the office, the patient was seen in urgent care on 3/12 for migraine and vomiting.  Patient wants to speak to Dr Claris CheFry's nurse today.

## 2013-12-13 ENCOUNTER — Encounter (HOSPITAL_BASED_OUTPATIENT_CLINIC_OR_DEPARTMENT_OTHER): Payer: Self-pay | Admitting: Emergency Medicine

## 2013-12-13 ENCOUNTER — Emergency Department (HOSPITAL_BASED_OUTPATIENT_CLINIC_OR_DEPARTMENT_OTHER)
Admission: EM | Admit: 2013-12-13 | Discharge: 2013-12-14 | Disposition: A | Discharge status: Home / Self Care | Payer: 59 | Attending: Emergency Medicine | Admitting: Emergency Medicine

## 2013-12-13 DIAGNOSIS — Z79899 Other long term (current) drug therapy: Secondary | ICD-10-CM | POA: Insufficient documentation

## 2013-12-13 DIAGNOSIS — G43909 Migraine, unspecified, not intractable, without status migrainosus: Secondary | ICD-10-CM | POA: Insufficient documentation

## 2013-12-13 DIAGNOSIS — Z8709 Personal history of other diseases of the respiratory system: Secondary | ICD-10-CM | POA: Insufficient documentation

## 2013-12-13 DIAGNOSIS — Z8669 Personal history of other diseases of the nervous system and sense organs: Secondary | ICD-10-CM | POA: Insufficient documentation

## 2013-12-13 NOTE — ED Notes (Signed)
Reports migraine type symptoms since 1900...took Immitrex and Advil with no relief.  C/o nausea, photophobia.

## 2013-12-14 NOTE — ED Provider Notes (Signed)
CSN: [id number]     Arrival date & time 12/13/13  2214 History   First MD Initiated Contact with Patient 12/14/13 0031     Chief Complaint  Patient presents with  . Headache     (Consider location/radiation/quality/duration/timing/severity/associated sxs/prior Treatment) HPI This is a 43 year old female with a history of migraines. She had a headache typical of her migraines that began about 7 PM yesterday evening. She took Imitrex and Advil without immediate relief. Her pain was worse than usual (9/10) and because it persisted she came to the ED. It was associated with nausea but no photophobia or focal neurologic deficit. While waiting in the ED her symptoms resolved and she is now pain-free.  Past Medical History  Diagnosis Date  . Peripheral neuropathy     per Dr. Dian Situ and Dr. Vear Clock  . Acute recurrent maxillary sinusitis     frequent, sees Dr. Narda Bonds  . Migraines   . Allergy    Past Surgical History  Procedure Laterality Date  . Lumbar disc surgery    . Nasal septum surgery     Family History  Problem Relation Age of Onset  . Hypertension    . Heart disease     History  Substance Use Topics  . Smoking status: Never Smoker   . Smokeless tobacco: Never Used  . Alcohol Use: Yes     Comment: occ   OB History   Grav Para Term Preterm Abortions TAB SAB Ect Mult Living                 Review of Systems  All other systems reviewed and are negative.    Allergies  Oxycodone-acetaminophen  Home Medications   Current Outpatient Rx  Name  Route  Sig  Dispense  Refill  . azithromycin (ZITHROMAX) 250 MG tablet      As directed   6 tablet   0   . cyclobenzaprine (FLEXERIL) 10 MG tablet   Oral   Take 1 tablet (10 mg total) by mouth 3 (three) times daily as needed for muscle spasms.   60 tablet   1   . fexofenadine (ALLEGRA) 180 MG tablet   Oral   Take 180 mg by mouth daily.         . fluticasone (FLONASE) 50 MCG/ACT nasal spray   Nasal  Place 2 sprays into the nose daily as needed.          . promethazine (PHENERGAN) 25 MG suppository   Rectal   Place 1 suppository (25 mg total) rectally every 6 (six) hours as needed for nausea or vomiting.   30 each   11   . promethazine (PHENERGAN) 25 MG tablet   Oral   Take 1 tablet (25 mg total) by mouth every 4 (four) hours as needed for nausea.   60 tablet   5   . SUMAtriptan (IMITREX) 100 MG tablet      TAKE 1 TABLET (100 MG TOTAL) BY MOUTH ONCE AS NEEDED FOR MIGRAINE.   12 tablet   11    BP 152/98  Pulse 77  Temp(Src) 97.4 F (36.3 C) (Oral)  Resp 16  Ht 5\' 3"  (1.6 m)  Wt 157 lb (71.215 kg)  BMI 27.82 kg/m2  SpO2 100%  Physical Exam General: Well-developed, well-nourished female in no acute distress; appearance consistent with age of record HENT: normocephalic; atraumatic Eyes: pupils equal, round and reactive to light; extraocular muscles intact Neck: supple Heart: regular rate and rhythm  Lungs: clear to auscultation bilaterally Abdomen: soft; nondistended; nontender; bowel sounds present Extremities: No deformity; full range of motion Neurologic: Awake, alert and oriented; motor function intact in all extremities and symmetric; no facial droop; normal coordination and speech; normal gait Skin: Warm and dry Psychiatric: Normal mood and affect    ED Course  Procedures (including critical care time)  MDM   Final diagnoses:  Migraine     Hanley SeamenJohn L Kaye Luoma, MD 12/14/13 [id number]

## 2013-12-14 NOTE — Discharge Instructions (Signed)
Migraine Headache A migraine headache is an intense, throbbing pain on one or both sides of your head. A migraine can last for 30 minutes to several hours. CAUSES  The exact cause of a migraine headache is not always known. However, a migraine may be caused when nerves in the brain become irritated and release chemicals that cause inflammation. This causes pain. Certain things may also trigger migraines, such as:  Alcohol.  Smoking.  Stress.  Menstruation.  Aged cheeses.  Foods or drinks that contain nitrates, glutamate, aspartame, or tyramine.  Lack of sleep.  Chocolate.  Caffeine.  Hunger.  Physical exertion.  Fatigue.  Medicines used to treat chest pain (nitroglycerine), birth control pills, estrogen, and some blood pressure medicines. SIGNS AND SYMPTOMS  Pain on one or both sides of your head.  Pulsating or throbbing pain.  Severe pain that prevents daily activities.  Pain that is aggravated by any physical activity.  Nausea, vomiting, or both.  Dizziness.  Pain with exposure to bright lights, loud noises, or activity.  General sensitivity to bright lights, loud noises, or smells. Before you get a migraine, you may get warning signs that a migraine is coming (aura). An aura may include:  Seeing flashing lights.  Seeing bright spots, halos, or zig-zag lines.  Having tunnel vision or blurred vision.  Having feelings of numbness or tingling.  Having trouble talking.  Having muscle weakness. DIAGNOSIS  A migraine headache is often diagnosed based on:  Symptoms.  Physical exam.  A CT scan or MRI of your head. These imaging tests cannot diagnose migraines, but they can help rule out other causes of headaches. TREATMENT Medicines may be given for pain and nausea. Medicines can also be given to help prevent recurrent migraines.  HOME CARE INSTRUCTIONS  Only take over-the-counter or prescription medicines for pain or discomfort as directed by your  health care provider. The use of long-term narcotics is not recommended.  Lie down in a dark, quiet room when you have a migraine.  Keep a journal to find out what may trigger your migraine headaches. For example, write down:  What you eat and drink.  How much sleep you get.  Any change to your diet or medicines.  Limit alcohol consumption.  Quit smoking if you smoke.  Get 7 9 hours of sleep, or as recommended by your health care provider.  Limit stress.  Keep lights dim if bright lights bother you and make your migraines worse. SEEK IMMEDIATE MEDICAL CARE IF:   Your migraine becomes severe.  You have a fever.  You have a stiff neck.  You have vision loss.  You have muscular weakness or loss of muscle control.  You start losing your balance or have trouble walking.  You feel faint or pass out.  You have severe symptoms that are different from your first symptoms. MAKE SURE YOU:   Understand these instructions.  Will watch your condition.  Will get help right away if you are not doing well or get worse. Document Released: 08/27/2005 Document Revised: 06/17/2013 Document Reviewed: 05/04/2013 ExitCare Patient Information 2014 [employer].  

## 2014-03-25 ENCOUNTER — Telehealth: Payer: Self-pay | Admitting: Family Medicine

## 2014-03-25 NOTE — Telephone Encounter (Signed)
Patient Information:  Caller Name: French Anaracy  Phone: [phone]  Patient: Kristine Sims, Kristine Sims  Gender: Female  DOB: 02/19/1971  Age: [age] Years  PCP: Gershon CraneFry, [facility](Family Practice)  Pregnant: No  Office Follow Up:  Does the office need to follow up with this patient?: No  Instructions For The Office: N/A   Symptoms  Reason For Call & Symptoms: For the past six months everytimes she eats withing 40 mins to an hour she has a painful  BM with undigested food.  Starts as fibrous BM and then goes to liquid with burning feeling.  She is having up to 4 BM's/day.  Reviewed Health History In EMR: Yes  Reviewed Medications In EMR: Yes  Reviewed Allergies In EMR: Yes  Reviewed Surgeries / Procedures: Yes  Date of Onset of Symptoms: 09/25/2013 OB / GYN:  LMP: Unknown  Guideline(s) Used:  Diarrhea  Disposition Per Guideline:   See Within 3 Days in Office  Reason For Disposition Reached:   Diarrhea persists > 7 days  Advice Given:  N/A  Patient Will Follow Care Advice:  YES  Appointment Scheduled:  03/26/2014 11:15:00 Appointment Scheduled Provider:  Gershon CraneFry, Stephen [facility](Family Practice)

## 2014-03-25 NOTE — Telephone Encounter (Signed)
Noted  

## 2014-03-26 ENCOUNTER — Ambulatory Visit (INDEPENDENT_AMBULATORY_CARE_PROVIDER_SITE_OTHER): Payer: PRIVATE HEALTH INSURANCE | Admitting: Family Medicine

## 2014-03-26 ENCOUNTER — Encounter: Payer: Self-pay | Admitting: Family Medicine

## 2014-03-26 VITALS — BP 128/79 | HR 78 | Temp 99.6°F | Ht 63.0 in | Wt 168.0 lb

## 2014-03-26 DIAGNOSIS — K589 Irritable bowel syndrome without diarrhea: Secondary | ICD-10-CM

## 2014-03-26 MED ORDER — DICYCLOMINE HCL 20 MG PO TABS: 20.0000 mg | ORAL_TABLET | Freq: Three times a day (TID) | ORAL | Status: DC

## 2014-03-26 NOTE — Progress Notes (Signed)
   Subjective:    Patient ID: Kristine Sims, female    DOB: 06/26/1971, 43 y.o.   MRN: [id number]  HPI Here for 6 months of abdominal issues including lower abdominal cramps, bloating, and diarrhea. No nausea or vomiting, no fever. She has gained a little weight in this time. She has been under a lot of stress lately over caring for her mother who had many health problems while living in [place], [place]. Her mother finally died his past week, and French Anaracy has moved back to [place] to be with her family. She notes that she feels the best between meals when her stomach is empty, then when she eats something her cramps begin within 15 minutes.    Review of Systems  Constitutional: Negative.   Gastrointestinal: Positive for abdominal pain and diarrhea. Negative for nausea, vomiting, constipation, blood in stool, abdominal distention, anal bleeding and rectal pain.       Objective:   Physical Exam  Constitutional: She appears well-developed and well-nourished.  Cardiovascular: Normal rate, regular rhythm, normal heart sounds and intact distal pulses.   Pulmonary/Chest: Effort normal and breath sounds normal.  Abdominal: Soft. Bowel sounds are normal. She exhibits no distension and no mass. There is no rebound and no guarding.  Mild generalized tenderness          Assessment & Plan:  Probable IBS. We discussed dietary measures she should take and foods to avoid. Try Bentyl tid

## 2014-03-26 NOTE — Progress Notes (Signed)
Pre visit review using our clinic review tool, if applicable. No additional management support is needed unless otherwise documented below in the visit note. 

## 2014-05-18 ENCOUNTER — Other Ambulatory Visit: Payer: Self-pay | Admitting: Family Medicine

## 2014-08-04 ENCOUNTER — Ambulatory Visit (INDEPENDENT_AMBULATORY_CARE_PROVIDER_SITE_OTHER): Payer: PRIVATE HEALTH INSURANCE | Admitting: Family Medicine

## 2014-08-04 ENCOUNTER — Encounter: Payer: Self-pay | Admitting: Family Medicine

## 2014-08-04 VITALS — BP 141/87 | HR 84 | Temp 99.4°F | Ht 63.0 in | Wt 191.0 lb

## 2014-08-04 DIAGNOSIS — F418 Other specified anxiety disorders: Secondary | ICD-10-CM | POA: Insufficient documentation

## 2014-08-04 DIAGNOSIS — F329 Major depressive disorder, single episode, unspecified: Secondary | ICD-10-CM

## 2014-08-04 DIAGNOSIS — F32A Depression, unspecified: Secondary | ICD-10-CM

## 2014-08-04 DIAGNOSIS — Z23 Encounter for immunization: Secondary | ICD-10-CM

## 2014-08-04 MED ORDER — BUPROPION HCL ER (XL) 150 MG PO TB24
150.0000 mg | ORAL_TABLET | Freq: Every day | ORAL | Status: DC
Start: 2014-08-04 — End: 2014-10-26

## 2014-08-04 MED ORDER — DIAZEPAM 5 MG PO TABS: 5.0000 mg | ORAL_TABLET | Freq: Three times a day (TID) | ORAL | Status: DC | PRN

## 2014-08-04 NOTE — Progress Notes (Signed)
Pre visit review using our clinic review tool, if applicable. No additional management support is needed unless otherwise documented below in the visit note. 

## 2014-08-04 NOTE — Progress Notes (Signed)
   Subjective:    Patient ID: Kristine Sims, female    DOB: 03/08/1971, 43 y.o.   MRN: [id number]  HPI Here to discuss depression. She has been struggling with this for the past 6 months or so. She sees April Forsbry for psychotherapy once a week. She feels sad and gets tearful, she has trouble sleeping, and she worries about things. She denies any suicidal thoughts. She also gets very anxious in crowds or when flying, and [place]. Her son takes Wellbutrin for depression and this works well for him.    Review of Systems  Constitutional: Negative.   Psychiatric/Behavioral: Positive for sleep disturbance, dysphoric mood and decreased concentration. Negative for suicidal ideas, hallucinations, behavioral problems, confusion, self-injury and agitation. The patient is nervous/anxious. The patient is not hyperactive.        Objective:   Physical Exam  Constitutional: She appears well-developed and well-nourished.  Psychiatric: Her behavior is normal. Judgment and thought content normal.  Good eye contact, mildly depressed affect           Assessment & Plan:  Try Wellbutrin XL 150 mg daily and Valium 5 mg prn. Recheck in 3 weeks. Advised her to get regular exercise.

## 2014-08-23 ENCOUNTER — Encounter: Payer: Self-pay | Admitting: Family Medicine

## 2014-08-23 ENCOUNTER — Ambulatory Visit (INDEPENDENT_AMBULATORY_CARE_PROVIDER_SITE_OTHER): Payer: PRIVATE HEALTH INSURANCE | Admitting: Family Medicine

## 2014-08-23 VITALS — BP 131/89 | HR 81 | Temp 99.6°F | Ht 63.0 in | Wt 193.0 lb

## 2014-08-23 DIAGNOSIS — F329 Major depressive disorder, single episode, unspecified: Secondary | ICD-10-CM

## 2014-08-23 DIAGNOSIS — F32A Depression, unspecified: Secondary | ICD-10-CM

## 2014-08-23 NOTE — Progress Notes (Signed)
   Subjective:    Patient ID: Kristine Sims C Demos, female    DOB: 04/30/1971, 43 y.o.   MRN: [id number]  HPI Here to recheck depression. She has been taking Wellbutrin for 3 weeks and she is pleased with the results. She feels happier most of the time and she is sleeping better. Less mood swings and she is enjoying some activities again that she had gotten away from (like reading books and listening to music). She takes one Valium about every other day. The [place] is leaving on [place] trip to [place] later this week.    Review of Systems  Constitutional: Negative.   Neurological: Negative.   Psychiatric/Behavioral: Negative for hallucinations, confusion, sleep disturbance, dysphoric mood, decreased concentration and agitation. The patient is not nervous/anxious and is not hyperactive.        Objective:   Physical Exam  Constitutional: She is oriented to person, place, and time. She appears well-developed and well-nourished.  Neurological: She is alert and oriented to person, place, and time.  Psychiatric: She has a normal mood and affect. Her behavior is normal. Thought content normal.          Assessment & Plan:  She is responding well to her current meds and she is still in psychotherapy. Recheck prn

## 2014-08-23 NOTE — Progress Notes (Signed)
Pre visit review using our clinic review tool, if applicable. No additional management support is needed unless otherwise documented below in the visit note. 

## 2014-10-26 ENCOUNTER — Other Ambulatory Visit: Payer: Self-pay

## 2014-10-26 MED ORDER — BUPROPION HCL ER (XL) 150 MG PO TB24: 150.0000 mg | ORAL_TABLET | Freq: Every day | ORAL | Status: DC

## 2014-11-23 ENCOUNTER — Other Ambulatory Visit: Payer: Self-pay

## 2014-11-23 MED ORDER — SUMATRIPTAN SUCCINATE 100 MG PO TABS: ORAL_TABLET | ORAL | Status: DC

## 2014-11-23 NOTE — Telephone Encounter (Signed)
Rx request for sumatriptan succ 100 mg tablet-Take 1 tablet by mouth once as needed for migraine #12  Pharm:  [facility] [address]   Pls advise.

## 2014-12-06 ENCOUNTER — Encounter (HOSPITAL_COMMUNITY): Payer: Self-pay | Admitting: Emergency Medicine

## 2014-12-06 ENCOUNTER — Emergency Department (HOSPITAL_COMMUNITY)
Admission: EM | Admit: 2014-12-06 | Discharge: 2014-12-06 | Disposition: A | Discharge status: Home / Self Care | Payer: BLUE CROSS/BLUE SHIELD | Attending: Emergency Medicine | Admitting: Emergency Medicine

## 2014-12-06 DIAGNOSIS — R079 Chest pain, unspecified: Secondary | ICD-10-CM | POA: Diagnosis not present

## 2014-12-06 DIAGNOSIS — Z8669 Personal history of other diseases of the nervous system and sense organs: Secondary | ICD-10-CM | POA: Diagnosis not present

## 2014-12-06 DIAGNOSIS — R61 Generalized hyperhidrosis: Secondary | ICD-10-CM | POA: Diagnosis not present

## 2014-12-06 DIAGNOSIS — Z79899 Other long term (current) drug therapy: Secondary | ICD-10-CM | POA: Insufficient documentation

## 2014-12-06 DIAGNOSIS — G43909 Migraine, unspecified, not intractable, without status migrainosus: Secondary | ICD-10-CM | POA: Insufficient documentation

## 2014-12-06 DIAGNOSIS — Z7952 Long term (current) use of systemic steroids: Secondary | ICD-10-CM | POA: Diagnosis not present

## 2014-12-06 LAB — CBC
HCT: 41.4 % (ref 36.0–46.0)
Hemoglobin: 14 g/dL (ref 12.0–15.0)
MCH: 31.6 pg (ref 26.0–34.0)
MCHC: 33.8 g/dL (ref 30.0–36.0)
MCV: 93.5 fL (ref 78.0–100.0)
Platelets: 287 10*3/uL (ref 150–400)
RBC: 4.43 MIL/uL (ref 3.87–5.11)
RDW: 12.3 % (ref 11.5–15.5)
WBC: 11.7 10*3/uL — ABNORMAL HIGH (ref 4.0–10.5)

## 2014-12-06 LAB — BASIC METABOLIC PANEL
Anion gap: 10 (ref 5–15)
BUN: 13 mg/dL (ref 6–23)
CALCIUM: 10.1 mg/dL (ref 8.4–10.5)
CO2: 25 mmol/L (ref 19–32)
CREATININE: 0.66 mg/dL (ref 0.50–1.10)
Chloride: 103 mmol/L (ref 96–112)
GFR calc Af Amer: >90 mL/min (ref >90)
Glucose, Bld: 99 mg/dL (ref 70–99)
Potassium: 4.2 mmol/L (ref 3.5–5.1)
SODIUM: 138 mmol/L (ref 135–145)

## 2014-12-06 LAB — I-STAT TROPONIN, ED: Troponin i, poc: 0 ng/mL (ref 0.00–0.08)

## 2014-12-06 NOTE — ED Notes (Signed)
Dr. Wofford at bedside 

## 2014-12-06 NOTE — ED Provider Notes (Signed)
CSN: [id number]     Arrival date & time 12/06/14  1612 History   First MD Initiated Contact with Patient 12/06/14 1929     Chief Complaint  Patient presents with  . Chest Pain  . Nausea  . Headache     (Consider location/radiation/quality/duration/timing/severity/associated sxs/prior Treatment) Patient is a 44 y.o. female presenting with chest pain.  Chest Pain Pain location:  Substernal area Pain quality: tightness   Pain radiates to:  Upper back Pain severity:  Mild Onset quality:  Gradual Duration:  45 minutes Timing:  Constant Progression:  Resolved Chronicity:  New Context comment:  She reports that she was working in the yard today. She came inside and felt dehydrated. However, she went to the store, and while standing in line she suddenly developed an episode of diaphoresis and malaise.  Relieved by:  Rest Worsened by:  Nothing tried Associated symptoms: diaphoresis, dizziness and nausea ("a tiny bit.")   Associated symptoms: no fever, no shortness of breath and not vomiting     Past Medical History  Diagnosis Date  . Peripheral neuropathy     per Dr. Dian Situ and Dr. Vear Clock  . Acute recurrent maxillary sinusitis     frequent, sees Dr. Narda Bonds  . Migraines   . Allergy    Past Surgical History  Procedure Laterality Date  . Lumbar disc surgery    . Nasal septum surgery     Family History  Problem Relation Age of Onset  . Hypertension    . Heart disease     History  Substance Use Topics  . Smoking status: Never Smoker   . Smokeless tobacco: Never Used  . Alcohol Use: 0.0 oz/week    0 Standard drinks or equivalent per week     Comment: occ   OB History    No data available     Review of Systems  Constitutional: Positive for diaphoresis. Negative for fever.  Respiratory: Negative for shortness of breath.   Cardiovascular: Positive for chest pain.  Gastrointestinal: Positive for nausea ("a tiny bit."). Negative for vomiting.  Neurological:  Positive for dizziness.  All other systems reviewed and are negative.     Allergies  Oxycodone-acetaminophen  Home Medications   Prior to Admission medications   Medication Sig Start Date End Date Taking? Authorizing Provider  buPROPion (WELLBUTRIN XL) 150 MG 24 hr tablet Take 1 tablet (150 mg total) by mouth daily. 10/26/14   Nelwyn Salisbury, MD  cyclobenzaprine (FLEXERIL) 10 MG tablet Take 1 tablet (10 mg total) by mouth 3 (three) times daily as needed for muscle spasms. Patient not taking: Reported on 08/04/2014 09/02/12   Baker Pierini, FNP  diazepam (VALIUM) 5 MG tablet Take 1 tablet (5 mg total) by mouth every 8 (eight) hours as needed for anxiety. 08/04/14   Nelwyn Salisbury, MD  dicyclomine (BENTYL) 20 MG tablet Take 1 tablet (20 mg total) by mouth 3 (three) times daily before meals. 03/26/14   Nelwyn Salisbury, MD  fexofenadine (ALLEGRA) 180 MG tablet Take 180 mg by mouth daily. Seasonal    Historical Provider, MD  fluticasone (FLONASE) 50 MCG/ACT nasal spray Place 2 sprays into the nose daily as needed.     Historical Provider, MD  levonorgestrel (MIRENA) 20 MCG/24HR IUD 1 each by Intrauterine route once.    Historical Provider, MD  promethazine (PHENERGAN) 25 MG suppository Place 1 suppository (25 mg total) rectally every 6 (six) hours as needed for nausea or vomiting. 11/20/13  Nelwyn SalisburyStephen A Fry, MD  promethazine (PHENERGAN) 25 MG tablet TAKE 1 TABLET (25 MG TOTAL) BY MOUTH EVERY 4 (FOUR) HOURS AS NEEDED FOR NAUSEA. 05/19/14   Nelwyn SalisburyStephen A Fry, MD  SUMAtriptan (IMITREX) 100 MG tablet TAKE 1 TABLET (100 MG TOTAL) BY MOUTH ONCE AS NEEDED FOR MIGRAINE. 11/23/14   Nelwyn SalisburyStephen A Fry, MD   BP 182/105 mmHg  Pulse 103  Temp(Src) 97.9 F (36.6 C) (Oral)  Resp 16  SpO2 97% Physical Exam  Constitutional: She is oriented to person, place, and time. She appears well-developed and well-nourished. No distress.  HENT:  Head: Normocephalic and atraumatic.  Mouth/Throat: Oropharynx is clear and moist.   Eyes: Conjunctivae are normal. Pupils are equal, round, and reactive to light. No scleral icterus.  Neck: Neck supple.  Cardiovascular: Normal rate, regular rhythm, normal heart sounds and intact distal pulses.   No murmur heard. Equal bilateral radial pulses  Pulmonary/Chest: Effort normal and breath sounds normal. No stridor. No respiratory distress. She has no rales.  Abdominal: Soft. Bowel sounds are normal. She exhibits no distension. There is no tenderness.  Musculoskeletal: Normal range of motion.  Neurological: She is alert and oriented to person, place, and time.  Skin: Skin is warm and dry. No rash noted.  Psychiatric: She has a normal mood and affect. Her behavior is normal.  Nursing note and vitals reviewed.   ED Course  Procedures (including critical care time) Labs Review Labs Reviewed  CBC - Abnormal; Notable for the following:    WBC 11.7 (*)    All other components within normal limits  BASIC METABOLIC PANEL  I-STAT TROPOININ, ED    Imaging Review No results found.   EKG Interpretation   Date/Time:  Monday December 06 2014 16:23:38 EDT Ventricular Rate:  103 PR Interval:  178 QRS Duration: 101 QT Interval:  328 QTC Calculation: 429 R Axis:   -2 Text Interpretation:  Sinus tachycardia Probable left atrial enlargement  RSR' in V1 or V2, right VCD or RVH Consider anterior infarct Baseline  wander in lead(s) III aVF V3 V6 Confirmed by DOCHERTY  MD, MEGAN ([id number]) on  12/06/2014 4:40:52 PM      MDM   Final diagnoses:  Diaphoresis  Chest pain, unspecified chest pain type    44 year old female who presents after an episode of diaphoresis and then chest pain. She was working in yard and felt that she was dehydrated. However, shortly thereafter, she was at the store standing in line when she developed an episode of diaphoresis and malaise. She was able to drive home. She went to lay down on the bed and during that time she developed what she describes as mild  chest tightness. Now she has no pain. I suspect her symptoms were secondary to dehydration and near syncope.  However, I offered further ED workup to evaluate for emergent causes of chest pain.  She declined further workup and understood the risks of missed diagnosis, including morbidity and mortality.  Advised PCP follow up and gave return precautions.   Blake DivineJohn Thaison Kolodziejski, MD 12/06/14 [phone]

## 2014-12-06 NOTE — ED Notes (Signed)
Pt c/o headache, nausea and mid chest pain that started today. States she was outside working today and broke out in huge sweat later on while in grocery store.

## 2014-12-06 NOTE — Discharge Instructions (Signed)

## 2014-12-06 NOTE — ED Notes (Signed)
Patient reports she is feeling better.  States, "I think I am just a little dehydrated."  She says she would like her lab results and she will go home.

## 2015-01-16 ENCOUNTER — Other Ambulatory Visit: Payer: Self-pay | Admitting: Family Medicine

## 2015-02-05 ENCOUNTER — Other Ambulatory Visit: Payer: Self-pay | Admitting: Family Medicine

## 2015-02-08 NOTE — Telephone Encounter (Signed)
Call in #60 with 2 rf 

## 2015-04-04 ENCOUNTER — Other Ambulatory Visit: Payer: Self-pay | Admitting: Family Medicine

## 2015-04-11 ENCOUNTER — Telehealth: Payer: Self-pay | Admitting: Family Medicine

## 2015-04-11 MED ORDER — TRAMADOL HCL 50 MG PO TABS: ORAL_TABLET | ORAL | Status: DC

## 2015-04-11 NOTE — Telephone Encounter (Signed)
Call in Tramadol 50 mg to take 1 or 2 tabs prn pain, #60 with no rf.

## 2015-04-11 NOTE — Telephone Encounter (Signed)
I called in script and spoke with pt. 

## 2015-04-11 NOTE — Telephone Encounter (Signed)
Pt call to say she has a migraine that will not go away. She said she has tried advil, excedrin for migraine and nothing helps. She is asking if some type of pain medicine can be called in to the below pharmacy    Ph[facility]acy; [facility]

## 2015-06-02 ENCOUNTER — Other Ambulatory Visit: Payer: Self-pay | Admitting: Family Medicine

## 2015-06-20 ENCOUNTER — Other Ambulatory Visit: Payer: Self-pay | Admitting: Family Medicine

## 2015-07-01 ENCOUNTER — Other Ambulatory Visit: Payer: Self-pay | Admitting: Family Medicine

## 2015-09-07 ENCOUNTER — Telehealth: Payer: Self-pay | Admitting: Family Medicine

## 2015-09-07 ENCOUNTER — Ambulatory Visit (INDEPENDENT_AMBULATORY_CARE_PROVIDER_SITE_OTHER): Payer: BLUE CROSS/BLUE SHIELD | Admitting: Family Medicine

## 2015-09-07 ENCOUNTER — Encounter: Payer: Self-pay | Admitting: Family Medicine

## 2015-09-07 VITALS — BP 140/90 | HR 101 | Temp 98.9°F | Ht 63.0 in | Wt 205.0 lb

## 2015-09-07 DIAGNOSIS — J209 Acute bronchitis, unspecified: Secondary | ICD-10-CM

## 2015-09-07 MED ORDER — HYDROCODONE-HOMATROPINE 5-1.5 MG/5ML PO SYRP: 5.0000 mL | ORAL_SOLUTION | ORAL | Status: DC | PRN

## 2015-09-07 MED ORDER — METHYLPREDNISOLONE ACETATE 80 MG/ML IJ SUSP
120.0000 mg | Freq: Once | INTRAMUSCULAR | Status: AC
  Administered 2015-09-07: 120 mg via INTRAMUSCULAR

## 2015-09-07 MED ORDER — AZITHROMYCIN 250 MG PO TABS: ORAL_TABLET | ORAL | Status: DC

## 2015-09-07 NOTE — Telephone Encounter (Signed)
Pt is on the schedule for today 09/07/2015 to see Dr. Clent RidgesFry.

## 2015-09-07 NOTE — Progress Notes (Signed)
   Subjective:    Patient ID: Kristine Sims, female    DOB: 03/16/1971, 44 y.o.   MRN: [id number]  HPI Here for 6 days of fever, body aches, diarrhea, headache and a dry cough. No N or V. Now today her chest is more congested and she is wheezing. Drinking fluids.    Review of Systems  Constitutional: Positive for fever, chills and diaphoresis.  HENT: Positive for congestion and sore throat. Negative for ear pain, postnasal drip and sinus pressure.   Eyes: Negative.   Respiratory: Positive for cough, chest tightness, shortness of breath and wheezing.   Cardiovascular: Negative.        Objective:   Physical Exam  Constitutional:  Ill appearing   HENT:  Right Ear: External ear normal.  Left Ear: External ear normal.  Nose: Nose normal.  Mouth/Throat: Oropharynx is clear and moist.  Eyes: Conjunctivae are normal.  Neck: No thyromegaly present.  Cardiovascular: Normal rate, regular rhythm, normal heart sounds and intact distal pulses.   Pulmonary/Chest: Effort normal. No respiratory distress. She has no rales.  Scattered wheezes and rhonchi   Abdominal: Soft. Bowel sounds are normal. She exhibits no distension and no mass. There is no tenderness. There is no rebound and no guarding.  Lymphadenopathy:    She has no cervical adenopathy.          Assessment & Plan:  This is a bronchitis secondary to a viral illness. Given a steroid shot and a Zpack. Use Advil for the aches and fever.

## 2015-09-07 NOTE — Telephone Encounter (Signed)
Ms. Kristine Sims called saying she spoke to a triage nurse after being transferred and was told to call back in 30mins to schedule an appt. She wants to be seen by Dr. Clent RidgesFry today. I informed her that his schedule is full and if she needs to be seen today, she may want to try the Urgent Care on [address]. Please give her a call regarding this.   Pt's ph# [phone] Thank you.

## 2015-09-07 NOTE — Addendum Note (Signed)
Addended by: Griselda MinerJIMENEZ, Chael Urenda E on: 09/07/2015 03:22 PM   Modules accepted: Orders

## 2015-09-20 ENCOUNTER — Other Ambulatory Visit: Payer: Self-pay | Admitting: Family Medicine

## 2015-09-21 ENCOUNTER — Telehealth: Payer: Self-pay

## 2015-09-21 MED ORDER — HYDROCODONE-HOMATROPINE 5-1.5 MG/5ML PO SYRP: 5.0000 mL | ORAL_SOLUTION | ORAL | Status: DC | PRN

## 2015-09-21 NOTE — Telephone Encounter (Signed)
Pt husband will come to pick up Rx

## 2015-09-21 NOTE — Telephone Encounter (Signed)
Pt requesting Rx for HYDROcodone-homatropine (HYDROMET) 5-1.5 MG/5ML syrup Pt last visit 09/07/15 Pt last Rx refill 09/07/15 240 ml

## 2015-09-21 NOTE — Telephone Encounter (Signed)
done

## 2015-09-29 ENCOUNTER — Other Ambulatory Visit: Payer: Self-pay | Admitting: Family Medicine

## 2016-01-31 ENCOUNTER — Other Ambulatory Visit: Payer: Self-pay | Admitting: Family Medicine

## 2016-01-31 NOTE — Telephone Encounter (Signed)
Call in #60 with 2 rf 

## 2016-07-03 DIAGNOSIS — F4323 Adjustment disorder with mixed anxiety and depressed mood: Secondary | ICD-10-CM | POA: Diagnosis not present

## 2016-07-12 DIAGNOSIS — F4323 Adjustment disorder with mixed anxiety and depressed mood: Secondary | ICD-10-CM | POA: Diagnosis not present

## 2016-07-17 ENCOUNTER — Encounter: Payer: Self-pay | Admitting: Family Medicine

## 2016-07-17 ENCOUNTER — Ambulatory Visit (INDEPENDENT_AMBULATORY_CARE_PROVIDER_SITE_OTHER): Payer: BLUE CROSS/BLUE SHIELD | Admitting: Family Medicine

## 2016-07-17 VITALS — BP 140/98 | HR 104 | Temp 99.0°F | Ht 63.0 in | Wt 220.0 lb

## 2016-07-17 DIAGNOSIS — Z111 Encounter for screening for respiratory tuberculosis: Secondary | ICD-10-CM | POA: Diagnosis not present

## 2016-07-17 DIAGNOSIS — R7611 Nonspecific reaction to tuberculin skin test without active tuberculosis: Secondary | ICD-10-CM

## 2016-07-17 DIAGNOSIS — Z008 Encounter for other general examination: Secondary | ICD-10-CM | POA: Diagnosis not present

## 2016-07-17 DIAGNOSIS — Z23 Encounter for immunization: Secondary | ICD-10-CM | POA: Diagnosis not present

## 2016-07-17 MED ORDER — DIAZEPAM 5 MG PO TABS: 5.0000 mg | ORAL_TABLET | Freq: Three times a day (TID) | ORAL | 2 refills | Status: DC | PRN

## 2016-07-17 NOTE — Progress Notes (Signed)
Pre visit review using our clinic review tool, if applicable. No additional management support is needed unless otherwise documented below in the visit note. 

## 2016-07-17 NOTE — Progress Notes (Signed)
   Subjective:    Patient ID: Kristine Sims, female    DOB: 07/10/1971, 45 y.o.   MRN: [id number]  HPI Here to [place] [facility] [employer]. 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 She feels fine. She does have a hx of spinal surgery so she needs to limit her lifting.    Review of Systems  Constitutional: Negative.   Respiratory: Negative.   Cardiovascular: Negative.   Neurological: Negative.        Objective:   Physical Exam  Constitutional: She is oriented to person, place, and time. She appears well-developed and well-nourished.  Neck: No thyromegaly present.  Cardiovascular: Normal rate, regular rhythm, normal heart sounds and intact distal pulses.   Pulmonary/Chest: Effort normal and breath sounds normal.  Lymphadenopathy:    She has no cervical adenopathy.  Neurological: She is alert and oriented to person, place, and time.          Assessment & Plan:  She seems to be doing well. A PPD is placed today and she will return in 2-3 days to have tis read. We will pass her as eligible to work with a limitation of no lifting more than 20 lbs.  Nelwyn SalisburyFRY,STEPHEN A, MD

## 2016-07-17 NOTE — Addendum Note (Signed)
Addended by: Aniceto BossNIMMONS, SYLVIA A on: 07/17/2016 05:08 PM   Modules accepted: Orders

## 2016-07-20 ENCOUNTER — Ambulatory Visit (INDEPENDENT_AMBULATORY_CARE_PROVIDER_SITE_OTHER)
Admission: RE | Admit: 2016-07-20 | Discharge: 2016-07-20 | Disposition: A | Discharge status: Home / Self Care | Payer: BLUE CROSS/BLUE SHIELD | Source: Ambulatory Visit | Attending: Internal Medicine | Admitting: Internal Medicine

## 2016-07-20 ENCOUNTER — Telehealth: Payer: Self-pay | Admitting: Family Medicine

## 2016-07-20 DIAGNOSIS — R7611 Nonspecific reaction to tuberculin skin test without active tuberculosis: Secondary | ICD-10-CM | POA: Diagnosis not present

## 2016-07-20 LAB — TB SKIN TEST
INDURATION: 2.5 mm
TB SKIN TEST: POSITIVE

## 2016-07-20 NOTE — Addendum Note (Signed)
Addended by: Aniceto BossNIMMONS, Jazmyne Beauchesne A on: 07/20/2016 02:51 PM   Modules accepted: Orders

## 2016-07-20 NOTE — Telephone Encounter (Signed)
Per Dr. Fabian SharpPanosh order a chest xray, pt was positive for a tb skin test read. I put in order and pt will get the xray done today.

## 2016-08-15 ENCOUNTER — Other Ambulatory Visit: Payer: Self-pay | Admitting: Family Medicine

## 2016-08-23 DIAGNOSIS — F4323 Adjustment disorder with mixed anxiety and depressed mood: Secondary | ICD-10-CM | POA: Diagnosis not present

## 2016-08-30 ENCOUNTER — Telehealth: Payer: Self-pay | Admitting: Family Medicine

## 2016-08-30 MED ORDER — SUMATRIPTAN SUCCINATE 100 MG PO TABS: ORAL_TABLET | ORAL | 2 refills | Status: DC

## 2016-08-30 NOTE — Telephone Encounter (Signed)
Pt need new Rx for sumatriptan   Pharm:  [facility]

## 2016-08-30 NOTE — Telephone Encounter (Signed)
I sent new script e-scribe to [facility].

## 2016-09-14 ENCOUNTER — Encounter: Payer: Self-pay | Admitting: Family Medicine

## 2016-09-14 ENCOUNTER — Ambulatory Visit (INDEPENDENT_AMBULATORY_CARE_PROVIDER_SITE_OTHER): Payer: BLUE CROSS/BLUE SHIELD | Admitting: Family Medicine

## 2016-09-14 VITALS — BP 140/92 | HR 90 | Temp 99.5°F | Ht 63.0 in | Wt 222.4 lb

## 2016-09-14 DIAGNOSIS — H6690 Otitis media, unspecified, unspecified ear: Secondary | ICD-10-CM

## 2016-09-14 DIAGNOSIS — G43809 Other migraine, not intractable, without status migrainosus: Secondary | ICD-10-CM

## 2016-09-14 DIAGNOSIS — J0100 Acute maxillary sinusitis, unspecified: Secondary | ICD-10-CM | POA: Diagnosis not present

## 2016-09-14 MED ORDER — AMOXICILLIN-POT CLAVULANATE 875-125 MG PO TABS: 1.0000 | ORAL_TABLET | Freq: Two times a day (BID) | ORAL | 0 refills | Status: DC

## 2016-09-14 MED ORDER — BENZONATATE 100 MG PO CAPS: 100.0000 mg | ORAL_CAPSULE | Freq: Two times a day (BID) | ORAL | 0 refills | Status: DC | PRN

## 2016-09-14 MED ORDER — PROMETHAZINE HCL 25 MG PO TABS: ORAL_TABLET | ORAL | 0 refills | Status: DC

## 2016-09-14 NOTE — Progress Notes (Signed)
HPI:  Acute visit for:  "sinus infection" -started: 2 weeks ago and worsening the last few days, report hx sinusitis -symptoms:nasal congestion, sore throat, cough - now with thick green nasal congestion and R max facial pain and R upper tooth pain and R ear pain -reports sinus issues often trigger migraines and she requests refill of phenergan incase needs it - reports current rx expired -denies:fever, SOB -has tried: nyquil and OTC options -sick contacts/travel/risks: no reported flu, strep or tick exposure -Hx of: allergies, migraines, sinus infections, remote sinus surgery, percocet allergy  ROS: See pertinent positives and negatives per HPI.  Past Medical History:  Diagnosis Date  . Acute recurrent maxillary sinusitis    frequent, sees Dr. Narda Bondshris Newman  . Allergy   . Migraines   . Peripheral neuropathy (HCC)    per Dr. Dian SituMichael Love and Dr. Vear ClockPhillips    Past Surgical History:  Procedure Laterality Date  . LUMBAR DISC SURGERY    . NASAL SEPTUM SURGERY      Family History  Problem Relation Age of Onset  . Hypertension    . Heart disease      Social History   Social History  . Marital status: Married    Spouse name: N/A  . Number of children: N/A  . Years of education: N/A   Social History Main Topics  . Smoking status: Never Smoker  . Smokeless tobacco: Never Used  . Alcohol use 0.0 oz/week     Comment: occ  . Drug use: No  . Sexual activity: Not Asked   Other Topics Concern  . None   Social History Narrative  . None     Current Outpatient Prescriptions:  .  buPROPion (WELLBUTRIN XL) 150 MG 24 hr tablet, TAKE 1 TABLET BY MOUTH DAILY, Disp: 30 tablet, Rfl: 5 .  cyclobenzaprine (FLEXERIL) 10 MG tablet, Take 1 tablet (10 mg total) by mouth 3 (three) times daily as needed for muscle spasms., Disp: 60 tablet, Rfl: 1 .  diazepam (VALIUM) 5 MG tablet, Take 1 tablet (5 mg total) by mouth every 8 (eight) hours as needed., Disp: 60 tablet, Rfl: 2 .   dicyclomine (BENTYL) 20 MG tablet, TAKE 1 TABLET (20 MG TOTAL) BY MOUTH 3 (THREE) TIMES DAILY BEFORE MEALS., Disp: 90 tablet, Rfl: 0 .  fluticasone (FLONASE) 50 MCG/ACT nasal spray, Place 2 sprays into the nose daily as needed. , Disp: , Rfl:  .  levonorgestrel (MIRENA) 20 MCG/24HR IUD, 1 each by Intrauterine route once., Disp: , Rfl:  .  promethazine (PHENERGAN) 25 MG tablet, TAKE 1 TABLET (25 MG TOTAL) BY MOUTH EVERY 6 (FOUR) HOURS AS NEEDED FOR NAUSEA., Disp: 40 tablet, Rfl: 0 .  SUMAtriptan (IMITREX) 100 MG tablet, TAKE 1 TABLET BY MOUTH ASNEEDED FOR MIGRAINE, Disp: 12 tablet, Rfl: 2 .  amoxicillin-clavulanate (AUGMENTIN) 875-125 MG tablet, Take 1 tablet by mouth 2 (two) times daily., Disp: 20 tablet, Rfl: 0 .  benzonatate (TESSALON) 100 MG capsule, Take 1 capsule (100 mg total) by mouth 2 (two) times daily as needed for cough., Disp: 20 capsule, Rfl: 0  EXAM:  Vitals:   09/14/16 0934  BP: (!) 140/92  Pulse: 90  Temp: 99.5 F (37.5 C)    Body mass index is 39.4 kg/m.  GENERAL: vitals reviewed and listed above, alert, oriented, appears well hydrated and in no acute distress  HEENT: atraumatic, conjunttiva clear, no obvious abnormalities on inspection of external nose and ears, normal appearance of ear canals and TMs  ecept for bulging red R TM with purulent effusion, no perforation, thick nasal congestion bilaterally, mild post oropharyngeal erythema with PND, no tonsillar edema or exudate, no sinus TTP  NECK: no obvious masses on inspection  LUNGS: clear to auscultation bilaterally, no wheezes, rales or rhonchi, good air movement  CV: HRRR, no peripheral edema  MS: moves all extremities without noticeable abnormality  PSYCH: pleasant and cooperative, no obvious depression or anxiety  ASSESSMENT AND PLAN:  Discussed the following assessment and plan:  Acute non-recurrent maxillary sinusitis  Acute otitis media, unspecified otitis media type  Other migraine without status  migrainosus, not intractable  We discussed potential etiologies, with R otitis media on exam and likely sinusitis opted to treat with Augmentin, tessalon for cough. Phenergan refilled. We discussed treatment side effects, likely course, transmission, potential complications and signs of developing a serious illness. BP was elevated, improved some on recheck, has been using OTC cold medications. Advised recheck with PCP in a few weeks. -of course, we advised to return or notify a doctor immediately if symptoms worsen or persist or new concerns arise.   Patient Instructions  BEFORE YOU LEAVE: -follow up: schedule follow up in 3-4 weeks for elevated blood pressure  Take the antibiotic (Augmentin) as prescribed.  Can try the tessalon per instructions for cough.  I sent a refill of your phenergan to the pharmacy.  Follow up sooner if worsening, new symptoms or not improving with treatment.  I hope you feel better soon!    Kriste Basque R., DO

## 2016-09-14 NOTE — Progress Notes (Signed)
Pre visit review using our clinic review tool, if applicable. No additional management support is needed unless otherwise documented below in the visit note. 

## 2016-09-14 NOTE — Patient Instructions (Signed)
BEFORE YOU LEAVE: -follow up: schedule follow up in 3-4 weeks for elevated blood pressure  Take the antibiotic (Augmentin) as prescribed.  Can try the tessalon per instructions for cough.  I sent a refill of your phenergan to the pharmacy.  Follow up sooner if worsening, new symptoms or not improving with treatment.  I hope you feel better soon!

## 2016-09-17 ENCOUNTER — Other Ambulatory Visit: Payer: Self-pay | Admitting: Family Medicine

## 2016-09-17 NOTE — Telephone Encounter (Signed)
I called the pt and informed her of the message below and she asked if Dr Clent RidgesFry was in the office today.  I advised her he was in the office today and she stated she does not mean to disrespect Dr Selena BattenKim, but would prefer that this message be sent to Dr Clent RidgesFry to address as she has the same problems every year and Dr Clent RidgesFry always gives her the cough medication which helps her.  Message sent to Dr Clent RidgesFry.

## 2016-09-17 NOTE — Telephone Encounter (Signed)
Would recommend re-eval if having persistent or severe symptoms despite the antibiotic and tessalon. Thanks.

## 2016-09-17 NOTE — Telephone Encounter (Signed)
Pt saw dr Selena Battenkim on Friday and Kimberlee Nearingtessalon is not working. Pt would like rx for hydrocodone cough syrup

## 2016-09-18 MED ORDER — HYDROCODONE-HOMATROPINE 5-1.5 MG/5ML PO SYRP: 5.0000 mL | ORAL_SOLUTION | ORAL | 0 refills | Status: DC | PRN

## 2016-09-18 NOTE — Addendum Note (Signed)
Addended by: Gershon CraneFRY, Rodgerick Gilliand A on: 09/18/2016 01:36 PM   Modules accepted: Orders

## 2016-09-18 NOTE — Telephone Encounter (Signed)
done

## 2016-09-18 NOTE — Telephone Encounter (Signed)
Pt notified to pick up rx at the front desk.

## 2016-09-19 ENCOUNTER — Ambulatory Visit: Payer: BLUE CROSS/BLUE SHIELD | Admitting: Family Medicine

## 2016-09-19 ENCOUNTER — Telehealth: Payer: Self-pay | Admitting: Family Medicine

## 2016-09-19 ENCOUNTER — Encounter: Payer: Self-pay | Admitting: Family Medicine

## 2016-09-19 VITALS — BP 142/102 | HR 97 | Ht 63.0 in | Wt 221.0 lb

## 2016-09-19 DIAGNOSIS — J019 Acute sinusitis, unspecified: Secondary | ICD-10-CM | POA: Diagnosis not present

## 2016-09-19 DIAGNOSIS — R030 Elevated blood-pressure reading, without diagnosis of hypertension: Secondary | ICD-10-CM | POA: Diagnosis not present

## 2016-09-19 DIAGNOSIS — B9689 Other specified bacterial agents as the cause of diseases classified elsewhere: Secondary | ICD-10-CM | POA: Diagnosis not present

## 2016-09-19 DIAGNOSIS — L270 Generalized skin eruption due to drugs and medicaments taken internally: Secondary | ICD-10-CM

## 2016-09-19 MED ORDER — METHYLPREDNISOLONE ACETATE 80 MG/ML IJ SUSP
80.0000 mg | Freq: Once | INTRAMUSCULAR | Status: AC
  Administered 2016-09-19: 80 mg via INTRAMUSCULAR

## 2016-09-19 MED ORDER — AZITHROMYCIN 250 MG PO TABS: ORAL_TABLET | ORAL | 0 refills | Status: DC

## 2016-09-19 NOTE — Telephone Encounter (Signed)
Spoke with pt and she states that she thinks she is having a reaction to Augmentin. She c/o rash, hives, itching and angioedema with some ShOB. She has taken 50mg  Benadryl and is improving some. She would like to be seen today and has [facility]. Scheduled pt with Dr Earlene PlaterWallace this afternoon. Advised to seek [facility] care [facility] or [facility]ioedema wor[facility]. Nothing further needed at this time.

## 2016-09-19 NOTE — Patient Instructions (Signed)
It was my pleasure to see you today!   1. Take your medications as prescribed.  2. Follow up as directed.  3. If your symptoms worsen or do not improve, please let us know. If you have any "RED FLAGS" that we reviewed today, please call 911 or proceed to the nearest Emergency Room.   YOU MAY ALSO TAKE ZYRTEC 10 MG DAILY AS WELL AS ZANTAC 150 MG TWICE DAILY TO KEEP THOSE HISTAMINE LEVELS DOWN.

## 2016-09-19 NOTE — Telephone Encounter (Signed)
Patient Name: Kristine Sims DOB: 06/08/1971 Initial Comment Caller states that she started on Amoxicillin on Friday takes one in the morning and one at night. She thinks she is having an allergic to it. She keeps getting itchy after she takes it. She could not sleep last night due to the itching. She is wheezing and face is swollen as well. Nurse Assessment Nurse: Charna Elizabethrumbull, RN, Cathy Date/Time (Eastern Time): 09/19/2016 1:28:24 PM Confirm and document reason for call. If symptomatic, describe symptoms. ---Caller states she is not having severe breathing or swallowing difficulty. She states she started Amoxicillin 5 days ago. She developed itching, wheezing and swelling of her face today. No rash or hives. No fever. Alert and responsive. Does the patient have any new or worsening symptoms? ---Yes Will a triage be completed? ---Yes Related visit to physician within the last 2 weeks? ---Yes Does the PT have any chronic conditions? (i.e. diabetes, asthma, etc.) ---Yes List chronic conditions. ---Migraines, Depression, Allergies, Started Amoxicillin for sinus/ear infection about 5 days ago (symptoms improved) Is the patient pregnant or possibly pregnant? (Ask all females between the ages of 3512-55) ---No Is this a behavioral health or substance abuse call? ---No Guidelines Guideline Title Affirmed Question Affirmed Notes Breathing Difficulty [1] MILD difficulty breathing (e.g., minimal/no SOB at rest, SOB with walking, pulse <100) AND [2] NEW-onset or WORSE than normal Face Swelling Difficulty breathing or wheezing Final Disposition User Call EMS 911 Now Follettrumbull, RN, Lynden AngCathy Comments Caller states she is having swelling of her face, wheezing and some difficulty breathing today. She is concerned that she is having a reaction to the Amoxicillin she started 5 days ago. She declined the Call 911 disposition. Reinforced the Call 911 disposition. She doesn't want to go to the ER and asks to  be seen at the office. Called the office backline and Zenon MayoSheena will assist her further. Connected Kennith Centerracey and [place] together. Referrals GO TO FACILITY OTHER - SPECIFY Disagree/Comply: Comply Disagree/Comply: Disagree Disagree/Comply Reason: Disagree with instructions Caller states she is having swelling of her face, wheezing and some difficulty breathing today. She is concerned that she is having a reaction to the Amoxicillin she started 5 days ago. She declined the Call 911 disposition. Reinforced the Call 911 disposition. She doesn't want to go to the ER and asks to be seen at the office. Called the office backline and Zenon MayoSheena will assist her further. Connected Kennith Cent[place]acey and [place] together.

## 2016-09-19 NOTE — Progress Notes (Addendum)
Kristine Sims is a 46 y.o. female here for an established patient visit to discuss:   History of Present Illness:   1. Drug Reaction: After several days of Augmentin. Some wheezing at home, none now. Denies Cp, SOB, Ha, dizziness, edema. Some red skin blotches, diffuse itching. No Hx of the same. Patient stopped Augmentin and took two Benadryl today.  2. Sinusitis: With sinus pressure and pain, with purulent nasal discharge, without fever, cough, dizziness.  PMHx, SurgHx, SocialHx, Medications, and Allergies were reviewed in the Visit Navigator and updated as appropriate.  Past Medical History:   Past Medical History:  Diagnosis Date  . Acute recurrent maxillary sinusitis    frequent, sees Dr. Narda Bondshris Newman  . Allergy   . Migraines   . Peripheral neuropathy (HCC)    per Dr. Dian SituMichael Sims and Dr. Vear Sims    Past Surgical History:   Past Surgical History:  Procedure Laterality Date  . LUMBAR DISC SURGERY    . NASAL SEPTUM SURGERY      Allergies:   Allergies  Allergen Reactions  . Amoxicillin Swelling  . Augmentin [Amoxicillin-Pot Clavulanate] Swelling  . Oxycodone-Acetaminophen     Current Medications:   Prior to Admission medications   Medication Sig Start Date End Date Taking? Authorizing Provider  benzonatate (TESSALON) 100 MG capsule Take 1 capsule (100 mg total) by mouth 2 (two) times daily as needed for cough. 09/14/16  Yes Terressa KoyanagiHannah R Kim, DO  buPROPion (WELLBUTRIN XL) 150 MG 24 hr tablet TAKE 1 TABLET BY MOUTH DAILY 08/20/16  Yes Nelwyn SalisburyStephen A Fry, MD  cyclobenzaprine (FLEXERIL) 10 MG tablet Take 1 tablet (10 mg total) by mouth 3 (three) times daily as needed for muscle spasms. 09/02/12  Yes Eulis FosterPadonda B Webb, FNP  diazepam (VALIUM) 5 MG tablet Take 1 tablet (5 mg total) by mouth every 8 (eight) hours as needed. 07/17/16  Yes Nelwyn SalisburyStephen A Fry, MD  dicyclomine (BENTYL) 20 MG tablet TAKE 1 TABLET (20 MG TOTAL) BY MOUTH 3 (THREE) TIMES DAILY BEFORE MEALS. 07/01/15  Yes Nelwyn SalisburyStephen A Fry,  MD  fluticasone (FLONASE) 50 MCG/ACT nasal spray Place 2 sprays into the nose daily as needed.    Yes Historical Provider, MD  HYDROcodone-homatropine (HYDROMET) 5-1.5 MG/5ML syrup Take 5 mLs by mouth every 4 (four) hours as needed. 09/18/16  Yes Nelwyn SalisburyStephen A Fry, MD  levonorgestrel (MIRENA) 20 MCG/24HR IUD 1 each by Intrauterine route once.   Yes Historical Provider, MD  promethazine (PHENERGAN) 25 MG tablet TAKE 1 TABLET (25 MG TOTAL) BY MOUTH EVERY 6 (FOUR) HOURS AS NEEDED FOR NAUSEA. 09/14/16  Yes Terressa KoyanagiHannah R Kim, DO  SUMAtriptan (IMITREX) 100 MG tablet TAKE 1 TABLET BY MOUTH ASNEEDED FOR MIGRAINE 08/30/16  Yes Nelwyn SalisburyStephen A Fry, MD    Family History:   Family History  Problem Relation Age of Onset  . Hypertension    . Heart disease      Social History:   Social History   Social History  . Marital status: Married    Spouse name: N/A  . Number of children: N/A  . Years of education: N/A   Occupational History  . Not on file.   Social History Main Topics  . Smoking status: Never Smoker  . Smokeless tobacco: Never Used  . Alcohol use 0.0 oz/week     Comment: occ  . Drug use: No  . Sexual activity: Not on file   Other Topics Concern  . Not on file   Social History Narrative  .  No narrative on file      Review of Systems:   CONSTITUTIONAL: Denies: fever, chills, diaphoresis, weakness, fatigue, weight loss, weight gain CARDIOVASCULAR: Denies: chest pain, dyspnea on exertion, orthopnea, paroxysmal nocturnal dyspnea, edema, palpitations RESPIRATORY: Denies: cough, hemoptysis, pleuritic chest pain GI: Denies: abdominal pain, flank pain, nausea, vomiting, diarrhea, constipation, black stool, blood in stool GU: Denies: dysuria, frequency/urgency, hematuria NEURO: Denies: dizzy/vertigo, headache, focal weakness, numbness/tingling, speech problems, loss of consciousness, confusion, memory loss MUSCULOSKELETAL: Denies: back pain, joint pain, joint stiffness, joint swelling, muscle pain,  muscle weakness PSYCH: Denies: anxiety, depression  Vitals:   Vitals:   09/19/16 1421  BP: (!) 142/102  Pulse: 97  SpO2: 96%  Weight: 221 lb (100.2 kg)  Height: 5\' 3"  (1.6 m)     Body mass index is 39.15 kg/m.   Physical Exam:   General: alert, cooperative, in NAD Eyes: PERRL, EOMI, conjunctiva clear HENT: oropharynx clear, moist mucous membranes, posterior pharyngeal erythema, PND, bilateral maxillary and frontal sinus ttp Cardiovascular: regular rate and rhythm, without murmurs, rubs or gallops Respiratory: CTAB, no increased WOB Extremities: no edema Skin: diffuse red splotches, excoriations from itching, no active hives   Assessment and Plan:   Kristine Sims was seen today for acute visit.  Diagnoses and all orders for this visit:  Drug eruption Comments: No red flags. Patient encouraged to use Benadryl or Zantac/Zyrtec to decrease her histamine over the next several days. PCN added to allergy list.  Orders: -     methylPREDNISolone acetate (DEPO-MEDROL) injection 80 mg; Inject 1 mL (80 mg total) into the muscle once.  Acute bacterial sinusitis -     azithromycin (ZITHROMAX) 250 MG tablet; 2 po today, then one po daily until gone.  Elevated blood pressure, situational Comments: Due to drug eruption and medications. Patient will monitor. Recheck at next visit.   . Reviewed expectations re: course of current medical issues. . Discussed self-management of symptoms. . Outlined signs and symptoms indicating need for more acute intervention. . Patient verbalized understanding and all questions were answered. . See orders for this visit as documented in the electronic medical record. . Patient received an After Visit Summary.   Helane Rima, D.O.

## 2016-09-25 ENCOUNTER — Ambulatory Visit (INDEPENDENT_AMBULATORY_CARE_PROVIDER_SITE_OTHER): Payer: BLUE CROSS/BLUE SHIELD | Admitting: Family Medicine

## 2016-09-25 ENCOUNTER — Encounter: Payer: Self-pay | Admitting: Family Medicine

## 2016-09-25 VITALS — BP 168/103 | HR 115 | Temp 98.7°F | Ht 63.0 in | Wt 222.0 lb

## 2016-09-25 DIAGNOSIS — T7840XD Allergy, unspecified, subsequent encounter: Secondary | ICD-10-CM | POA: Diagnosis not present

## 2016-09-25 DIAGNOSIS — H6691 Otitis media, unspecified, right ear: Secondary | ICD-10-CM | POA: Diagnosis not present

## 2016-09-25 DIAGNOSIS — J018 Other acute sinusitis: Secondary | ICD-10-CM | POA: Diagnosis not present

## 2016-09-25 MED ORDER — HYDROCODONE-HOMATROPINE 5-1.5 MG/5ML PO SYRP: 5.0000 mL | ORAL_SOLUTION | ORAL | 0 refills | Status: DC | PRN

## 2016-09-25 MED ORDER — LEVOFLOXACIN 500 MG PO TABS: 500.0000 mg | ORAL_TABLET | Freq: Every day | ORAL | 0 refills | Status: AC

## 2016-09-25 MED ORDER — METHYLPREDNISOLONE 4 MG PO TBPK: ORAL_TABLET | ORAL | 0 refills | Status: DC

## 2016-09-25 NOTE — Progress Notes (Signed)
Pre visit review using our clinic review tool, if applicable. No additional management support is needed unless otherwise documented below in the visit note. 

## 2016-09-25 NOTE — Progress Notes (Signed)
   Subjective:    Patient ID: Kristine Sims, female    DOB: 03/02/1971, 46 y.o.   MRN: [id number]  HPI Here to follow up ongoing sinus issues. About 4 weeks ago she developed sinus pressure, ear pain, blowing green mucus from the nose, and a hard dry cough. No fever. She was seen here on 09-14-16 and was diagnosed with sinusitis and right otitis media. She was given Augmentin and Benzonatate. Over the next few days she developed swelling around the face, itching all over the body, and wheezing. She was seen here again on 09-19-16 and it was determined she was having an allergic reaction the Augmentin. This was stopped, she was given 80 mg of Depomedrol, and she took Benadryl. Now all the allergic reaction symptoms are gone. She was also given a Zpack that second visit but the sinus infection is no better.    Review of Systems  Constitutional: Negative.   HENT: Positive for congestion, ear pain, postnasal drip, sinus pain and sinus pressure. Negative for sore throat.   Eyes: Negative.   Respiratory: Positive for cough.        Objective:   Physical Exam  Constitutional: She is oriented to person, place, and time. She appears well-developed and well-nourished.  HENT:  Left Ear: External ear normal.  Nose: Nose normal.  Mouth/Throat: Oropharynx is clear and moist.  Right TM is red and dull   Eyes: Conjunctivae are normal.  Neck: Neck supple. No thyromegaly present.  Pulmonary/Chest: Effort normal and breath sounds normal. No respiratory distress. She has no wheezes. She has no rales.  Lymphadenopathy:    She has no cervical adenopathy.  Neurological: She is alert and oriented to person, place, and time.          Assessment & Plan:  She had an allergic reaction to Augmentin, so this was stopped. The reaction appears to have resolved. She still has a sinusitis and an otitis media, so we will treat with Levaquin. Given a Medrol dose pack and cough syrup. Recheck prn.  Gershon CraneStephen Markise Haymer, MD

## 2016-12-10 ENCOUNTER — Other Ambulatory Visit: Payer: Self-pay | Admitting: Family Medicine

## 2016-12-10 NOTE — Telephone Encounter (Signed)
Can we refill this? 

## 2017-03-12 ENCOUNTER — Telehealth: Payer: Self-pay | Admitting: Family Medicine

## 2017-03-12 MED ORDER — SUMATRIPTAN SUCCINATE 100 MG PO TABS: ORAL_TABLET | ORAL | 2 refills | Status: DC

## 2017-03-12 NOTE — Telephone Encounter (Signed)
I sent script e-scribe to [facility].

## 2017-03-12 NOTE — Telephone Encounter (Signed)
Pt needs new rx on sumatriptan  100 mg #12 [facility] [address] [place]

## 2017-03-22 ENCOUNTER — Other Ambulatory Visit: Payer: Self-pay | Admitting: Family Medicine

## 2017-03-25 NOTE — Telephone Encounter (Signed)
Call in #60 with 2 rf 

## 2017-05-28 DIAGNOSIS — F4323 Adjustment disorder with mixed anxiety and depressed mood: Secondary | ICD-10-CM | POA: Diagnosis not present

## 2017-05-29 ENCOUNTER — Other Ambulatory Visit: Payer: Self-pay | Admitting: Family Medicine

## 2017-05-29 NOTE — Telephone Encounter (Signed)
Call in #60 with 2 rf 

## 2017-05-29 NOTE — Telephone Encounter (Signed)
See my note

## 2017-05-30 ENCOUNTER — Encounter: Payer: Self-pay | Admitting: Family Medicine

## 2017-05-31 ENCOUNTER — Encounter: Payer: Self-pay | Admitting: Physician Assistant

## 2017-05-31 ENCOUNTER — Ambulatory Visit (INDEPENDENT_AMBULATORY_CARE_PROVIDER_SITE_OTHER): Payer: BLUE CROSS/BLUE SHIELD | Admitting: Physician Assistant

## 2017-05-31 VITALS — BP 138/90 | HR 100 | Temp 98.5°F | Resp 16 | Ht 63.39 in | Wt 219.0 lb

## 2017-05-31 DIAGNOSIS — Z114 Encounter for screening for human immunodeficiency virus [HIV]: Secondary | ICD-10-CM | POA: Diagnosis not present

## 2017-05-31 DIAGNOSIS — F418 Other specified anxiety disorders: Secondary | ICD-10-CM

## 2017-05-31 MED ORDER — SERTRALINE HCL 50 MG PO TABS
50.0000 mg | ORAL_TABLET | Freq: Every day | ORAL | 3 refills | Status: DC
Start: 2017-05-31 — End: 2018-11-12

## 2017-05-31 NOTE — Patient Instructions (Addendum)
Follow-up with Dr. Clent Ridges or myself in 4-6 weeks.  When starting the sertraline, take 1/2 tablet daily for the first week, then increase to one daily.    IF you received an x-ray today, you will receive an invoice from [facility]. Please contact [facility] at [phone] with questions or concerns regarding your invoice.   IF you received labwork today, you will receive an invoice from [facility]. Please contact [facility] at [phone] with questions or concerns regarding your invoice.   Our billing staff will not be able to assist you with questions regarding bills from these companies.  You will be contacted with the lab results as soon as they are available. The fastest way to get your results is to activate your My Chart account. Instructions are located on the last page of this paperwork. If you have not heard from [place] regarding the results in 2 weeks, please contact this office.

## 2017-05-31 NOTE — Assessment & Plan Note (Signed)
Start sertraline daily (25 mg daily x 1 week, then 50 mg daily). Re-evaluate here or with her PCP in 4-6 weeks. Consider increasing sertraline dose, adding bupropion back). Continue therapy.

## 2017-05-31 NOTE — Progress Notes (Signed)
Patient ID: Kristine Sims, female     DOB: 1971/07/19, 46 y.o.    MRN: [id number]  PCP: Nelwyn Salisbury, MD  Chief Complaint  Patient presents with  . Establish Care    Subjective:   This patient is new to me and presents for evaluation of depression and anxiety.  Her therapist, Shanon Rosser, recommended me for this issue, in part due to the urgency. She loves her PCP, and plans to stay with him, but needed to be seen sooner than he could get her in. She plans to follow-up with him.   Long-standing socaial anxiety and claustrophobia. She has been able to manage these issues by sheer force of will in the past. Increased anxiety since her mother's health decline and death about 3 years ago.  Notes that she has gradually increased her alcohol consumption from 1-2 drinks occasionally, to 14/week. Prefers to stay at home, isn't wanting to engage with friends, see movies with her family. Tremulous. Finds herself just wanting to stay home and watch TV. Has had two episodes in the past 3 months of "massive falling apart," and is distressed that her children witnessed her emotional breakdown. Occasionally uses diazepam for increased anxiety symptoms, typically with travel. Her therapist recommended increased use for the short term.  Initially, she and her PCP thought that her symptoms were due to her grief. But she believes that she is now experiencing profound depression. Taking Wellbutrin has never really made any difference for her. She has stopped and restarted it several times, without any benefit.  Grew up in a dysfunctional home. Parents were functional alcoholics. Mother was verbally abusive. Older brother served as the parent. "My father was afraid of her, too, so he didn't stand up to her" or for them. "Keep your mouth shut and don't get noticed" was their mantra. The 3 older siblings towed the line, but the youngest brother engaged with their mother and bore the brunt of  her abuse. He is no longer in contact with anyone in the family.   Review of Systems As above. No suicidal or homicidal ideations.  Prior to Admission medications   Medication Sig Start Date End Date Taking? Authorizing Provider  buPROPion (WELLBUTRIN XL) 150 MG 24 hr tablet TAKE 1 TABLET BY MOUTH DAILY 08/20/16   Nelwyn Salisbury, MD  cyclobenzaprine (FLEXERIL) 10 MG tablet Take 1 tablet (10 mg total) by mouth 3 (three) times daily as needed for muscle spasms. 09/02/12  Yes Worthy Rancher B, FNP  diazepam (VALIUM) 5 MG tablet TAKE 1 TABLET BY MOUTH EVERY 8 HOURS AS NEEDED 05/30/17  Yes Nelwyn Salisbury, MD  dicyclomine (BENTYL) 20 MG tablet TAKE 1 TABLET (20 MG TOTAL) BY MOUTH 3 (THREE) TIMES DAILY BEFORE MEALS. 07/01/15  Yes Nelwyn Salisbury, MD  levonorgestrel (MIRENA) 20 MCG/24HR IUD 1 each by Intrauterine route once.   Yes [provider]  promethazine (PHENERGAN) 25 MG tablet TAKE 1 TABLET BY MOUTH EVERY 6 HOURS AS NEEDED FOR NAUSEA 03/25/17  Yes Nelwyn Salisbury, MD  SUMAtriptan (IMITREX) 100 MG tablet TAKE 1 TABLET BY MOUTH ASNEEDED FOR MIGRAINE 03/12/17  Yes Nelwyn Salisbury, MD     Allergies  Allergen Reactions  . Amoxicillin Swelling  . Augmentin [Amoxicillin-Pot Clavulanate] Swelling  . Oxycodone-Acetaminophen      Patient Active Problem List   Diagnosis Date Noted  . Drug eruption 09/19/2016  . Depression 08/04/2014  . Asthma attack 09/07/2011  . NECK SPRAIN  AND STRAIN 11/04/2009  . ALLERGIC RHINITIS 07/12/2009  . FATIGUE 10/26/2008  . Acute bacterial sinusitis 06/17/2008  . PERIPHERAL NEUROPATHY 08/08/2007  . OTITIS MEDIA 08/08/2007  . CHICKENPOX, HX OF 08/08/2007     Family History  Problem Relation Age of Onset  . Hypertension Unknown   . Heart disease Unknown   . Cancer Mother        lung cancer  . Hypertension Father   . Heart disease Father   . Hyperlipidemia Brother   . Hypertension Brother   . Mental illness Sister        depression  . Hypertension  Brother   . Asperger's syndrome Son   . ADD / ADHD Son      Social History   Social History  . Marital status: Married    Spouse name: N/A  . Number of children: 3  . Years of education: N/A   Occupational History  . homemaker Homemaker  . 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 Smoking status: Never Smoker  . Smokeless tobacco: Never Used  . Alcohol use 8.4 oz/week    14 Standard drinks or equivalent per week     Comment: increased from occasionally to cope with anxiety, 2015  . Drug use: No  . Sexual activity: Yes    Partners: Male    Birth control/ protection: IUD     Comment: one partner (her husband)   Other Topics Concern  . Not on file   Social History Narrative   Lives with her husband and two sons.   Her younger brother has disconnected from the family for unknown reasons, in 2005.         Objective:  Physical Exam  Constitutional: She is oriented to person, place, and time. She appears well-developed and well-nourished. She is active and cooperative. No distress.  BP 138/90   Pulse 100   Temp 98.5 F (36.9 C) (Oral)   Resp 16   Ht 5' 3.39" (1.61 m)   Wt 219 lb (99.3 kg)   SpO2 97%   BMI 38.32 kg/m   HENT:  Head: Normocephalic and atraumatic.  Right Ear: Hearing normal.  Left Ear: Hearing normal.  Eyes: Conjunctivae are normal. No scleral icterus.  Neck: Normal range of motion. Neck supple. No thyromegaly present.  Cardiovascular: Normal rate, regular rhythm and normal heart sounds.   Pulses:      Radial pulses are 2+ on the right side, and 2+ on the left side.  Pulmonary/Chest: Effort normal and breath sounds normal.  Lymphadenopathy:       Head (right side): No tonsillar, no preauricular, no posterior auricular and no occipital adenopathy present.       Head (left side): No tonsillar, no preauricular, no posterior auricular and no occipital adenopathy present.    She has no cervical adenopathy.        Right: No supraclavicular adenopathy present.       Left: No supraclavicular adenopathy present.  Neurological: She is alert and oriented to person, place, and time. No sensory deficit.  Skin: Skin is warm, dry and intact. No rash noted. No cyanosis or erythema. Nails show no clubbing.  Psychiatric: She has a normal mood and affect. Her speech is normal and behavior is normal.       Assessment & Plan:   Problem List Items Addressed This Visit    Depression with anxiety - Primary    Start sertraline  daily (25 mg daily x 1 week, then 50 mg daily). Re-evaluate here or with her PCP in 4-6 weeks. Consider increasing sertraline dose, adding bupropion back). Continue therapy.      Relevant Medications   sertraline (ZOLOFT) 50 MG tablet   Other Relevant Orders   TSH    Other Visit Diagnoses    Screening for HIV (human immunodeficiency virus)       Relevant Orders   HIV antibody       Return for re-evaluation in 4-6 weeks, here or with Dr. Clent Ridges.   Fernande Bras, PA-C [facility] at [facility]

## 2017-06-01 LAB — HIV ANTIBODY (ROUTINE TESTING W REFLEX): HIV Screen 4th Generation wRfx: NONREACTIVE

## 2017-06-01 LAB — TSH: TSH: 2.73 u[IU]/mL (ref 0.450–4.500)

## 2017-07-01 DIAGNOSIS — F4323 Adjustment disorder with mixed anxiety and depressed mood: Secondary | ICD-10-CM | POA: Diagnosis not present

## 2017-07-08 DIAGNOSIS — F4323 Adjustment disorder with mixed anxiety and depressed mood: Secondary | ICD-10-CM | POA: Diagnosis not present

## 2017-07-16 DIAGNOSIS — Z23 Encounter for immunization: Secondary | ICD-10-CM | POA: Diagnosis not present

## 2017-10-01 ENCOUNTER — Telehealth: Payer: Self-pay | Admitting: Family Medicine

## 2017-10-01 NOTE — Telephone Encounter (Signed)
Sent to PCP for approval to send in a muscle relaxer.   Last OV 09/25/2016  Last refilled 09/02/2012 disp 60 with 1 refill   [facility] [address]

## 2017-10-01 NOTE — Telephone Encounter (Signed)
[facility] [address]

## 2017-10-01 NOTE — Telephone Encounter (Signed)
Copied from CRM [phone]. 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 Patient states she has been prescribed flexeril in the past.

## 2017-10-03 MED ORDER — CYCLOBENZAPRINE HCL 10 MG PO TABS: ORAL_TABLET | ORAL | 1 refills | Status: DC

## 2017-10-03 NOTE — Telephone Encounter (Signed)
Call in Flexeril 10 mg to use every 8 hours prn muscle spasms, #60 with one rf

## 2018-01-31 ENCOUNTER — Other Ambulatory Visit: Payer: Self-pay | Admitting: Obstetrics and Gynecology

## 2018-01-31 ENCOUNTER — Encounter: Payer: Self-pay | Admitting: Family Medicine

## 2018-01-31 ENCOUNTER — Ambulatory Visit: Payer: BLUE CROSS/BLUE SHIELD | Admitting: Family Medicine

## 2018-01-31 VITALS — BP 150/100 | HR 94 | Temp 98.5°F | Ht 63.25 in | Wt 213.2 lb

## 2018-01-31 DIAGNOSIS — J019 Acute sinusitis, unspecified: Secondary | ICD-10-CM

## 2018-01-31 DIAGNOSIS — Z1231 Encounter for screening mammogram for malignant neoplasm of breast: Secondary | ICD-10-CM

## 2018-01-31 MED ORDER — LEVOFLOXACIN 500 MG PO TABS: 500.0000 mg | ORAL_TABLET | Freq: Every day | ORAL | 0 refills | Status: AC

## 2018-01-31 MED ORDER — HYDROCODONE-HOMATROPINE 5-1.5 MG/5ML PO SYRP: 5.0000 mL | ORAL_SOLUTION | ORAL | 0 refills | Status: DC | PRN

## 2018-01-31 NOTE — Progress Notes (Signed)
   Subjective:    Patient ID: Kristine Sims, female    DOB: 12-12-1970, 47 y.o.   MRN: [id number]  HPI Here for 10 days of sinus pressure, PND, and coughing up yellow sputum. No fever. On Nyquil.    Review of Systems  Constitutional: Negative.   HENT: Positive for congestion, postnasal drip, sinus pressure and sinus pain. Negative for sore throat.   Eyes: Negative.   Respiratory: Positive for cough.        Objective:   Physical Exam  Constitutional: She appears well-developed and well-nourished.  HENT:  Right Ear: External ear normal.  Left Ear: External ear normal.  Nose: Nose normal.  Mouth/Throat: Oropharynx is clear and moist.  Eyes: Conjunctivae are normal.  Neck: No thyromegaly present.  Pulmonary/Chest: Effort normal and breath sounds normal. No stridor. No respiratory distress. She has no wheezes. She has no rales.  Lymphadenopathy:    She has no cervical adenopathy.          Assessment & Plan:  Sinusitis, treat with Levaquin.  Gershon Crane, MD

## 2018-02-07 ENCOUNTER — Other Ambulatory Visit: Payer: Self-pay | Admitting: Family Medicine

## 2018-02-07 NOTE — Telephone Encounter (Signed)
Last OV 01/31/2018  Last refilled 01/31/2018 disp 240 ml no refills   Sent to PCP TO ADVISE

## 2018-02-10 MED ORDER — HYDROCODONE-HOMATROPINE 5-1.5 MG/5ML PO SYRP: 5.0000 mL | ORAL_SOLUTION | ORAL | 0 refills | Status: DC | PRN

## 2018-02-10 NOTE — Telephone Encounter (Signed)
Done

## 2018-02-18 ENCOUNTER — Ambulatory Visit
Admission: RE | Admit: 2018-02-18 | Discharge: 2018-02-18 | Disposition: A | Discharge status: Home / Self Care | Payer: BLUE CROSS/BLUE SHIELD | Source: Ambulatory Visit | Attending: Obstetrics and Gynecology | Admitting: Obstetrics and Gynecology

## 2018-02-18 DIAGNOSIS — Z1231 Encounter for screening mammogram for malignant neoplasm of breast: Secondary | ICD-10-CM | POA: Diagnosis not present

## 2018-04-21 ENCOUNTER — Telehealth: Payer: Self-pay | Admitting: Family Medicine

## 2018-04-21 NOTE — Telephone Encounter (Signed)
Rx was filled on 10/03/17 take 1 tablet every 4 hours as needed. Rx was written as take one tablet 3 times a day as needed. Pharmacy has reached out to patient to see how often they have been taking the Rx but have not heard back from the patient. Pt called in for refill today, Pharmacist suspects the patient has been taking less than prescribed.  Please advise.

## 2018-04-21 NOTE — Telephone Encounter (Signed)
Copied from CRM [phone]. Topic: Quick Communication - See Telephone Encounter >> Apr 21, 2018 11:23 AM Lorrine KinMcGee, Bueford Arp B, NT wrote: CRM for notification. See Telephone encounter for: 04/21/18. [facility] [address] calling and states that the cyclobenzaprine (FLEXERIL) 10 MG tablet was written to take 1 tablet every 8 hours, but was filled as 1 tablet every 4 hours. Would like a call back to discuss. Please advise. CB#: [phone]

## 2018-04-22 NOTE — Telephone Encounter (Signed)
Noted. Pharmacy has contacted patient to correct this.

## 2018-04-22 NOTE — Telephone Encounter (Signed)
I agree, this was an error. Flexeril should be taken every 8 hours prn

## 2018-04-25 DIAGNOSIS — M5412 Radiculopathy, cervical region: Secondary | ICD-10-CM | POA: Diagnosis not present

## 2018-04-25 DIAGNOSIS — M25511 Pain in right shoulder: Secondary | ICD-10-CM | POA: Diagnosis not present

## 2018-05-15 DIAGNOSIS — M5032 Other cervical disc degeneration, mid-cervical region, unspecified level: Secondary | ICD-10-CM | POA: Diagnosis not present

## 2018-05-15 DIAGNOSIS — M5412 Radiculopathy, cervical region: Secondary | ICD-10-CM | POA: Diagnosis not present

## 2018-05-15 DIAGNOSIS — M542 Cervicalgia: Secondary | ICD-10-CM | POA: Diagnosis not present

## 2018-05-18 DIAGNOSIS — M542 Cervicalgia: Secondary | ICD-10-CM | POA: Diagnosis not present

## 2018-05-27 DIAGNOSIS — Z01419 Encounter for gynecological examination (general) (routine) without abnormal findings: Secondary | ICD-10-CM | POA: Diagnosis not present

## 2018-05-27 DIAGNOSIS — Z6837 Body mass index (BMI) 37.0-37.9, adult: Secondary | ICD-10-CM | POA: Diagnosis not present

## 2018-05-27 DIAGNOSIS — Z1151 Encounter for screening for human papillomavirus (HPV): Secondary | ICD-10-CM | POA: Diagnosis not present

## 2018-06-03 DIAGNOSIS — M542 Cervicalgia: Secondary | ICD-10-CM | POA: Diagnosis not present

## 2018-06-03 DIAGNOSIS — M5412 Radiculopathy, cervical region: Secondary | ICD-10-CM | POA: Diagnosis not present

## 2018-06-03 DIAGNOSIS — M5032 Other cervical disc degeneration, mid-cervical region, unspecified level: Secondary | ICD-10-CM | POA: Diagnosis not present

## 2018-06-25 DIAGNOSIS — M5013 Cervical disc disorder with radiculopathy, cervicothoracic region: Secondary | ICD-10-CM | POA: Diagnosis not present

## 2018-06-25 DIAGNOSIS — M542 Cervicalgia: Secondary | ICD-10-CM | POA: Diagnosis not present

## 2018-06-27 DIAGNOSIS — M542 Cervicalgia: Secondary | ICD-10-CM | POA: Diagnosis not present

## 2018-06-27 DIAGNOSIS — M5013 Cervical disc disorder with radiculopathy, cervicothoracic region: Secondary | ICD-10-CM | POA: Diagnosis not present

## 2018-07-04 DIAGNOSIS — M542 Cervicalgia: Secondary | ICD-10-CM | POA: Diagnosis not present

## 2018-07-04 DIAGNOSIS — M5013 Cervical disc disorder with radiculopathy, cervicothoracic region: Secondary | ICD-10-CM | POA: Diagnosis not present

## 2018-07-11 DIAGNOSIS — M5013 Cervical disc disorder with radiculopathy, cervicothoracic region: Secondary | ICD-10-CM | POA: Diagnosis not present

## 2018-07-11 DIAGNOSIS — M542 Cervicalgia: Secondary | ICD-10-CM | POA: Diagnosis not present

## 2018-11-03 ENCOUNTER — Other Ambulatory Visit: Payer: Self-pay | Admitting: Family Medicine

## 2018-11-03 NOTE — Telephone Encounter (Signed)
Copied from CRM [id number]. Topic: Quick Communication - Rx Refill/Question >> Nov 03, 2018  1:35 PM [place], [place] D wrote: Medication: promethazine (PHENERGAN) 25 MG tablet / SUMAtriptan (IMITREX) 100 MG tablet  Has the patient contacted their pharmacy? Yes.   (Agent: If no, request that the patient contact the pharmacy for the refill.) (Agent: If yes, when and what did the pharmacy advise?)  Preferred Pharmacy (with phone number or street name): [facility] [address] [place] - [address] [phone] (Phone) [phone] (Fax)  Agent: Please be advised that RX refills may take up to 3 business days. We ask that you follow-up with your pharmacy.

## 2018-11-03 NOTE — Telephone Encounter (Signed)
Pt also requesting a refill of Sumatriptan 100 mg which is not on the pt's current med list

## 2018-11-04 NOTE — Telephone Encounter (Signed)
Dr. Clent Ridges please advise on refills for the pt   thanks

## 2018-11-06 MED ORDER — PROMETHAZINE HCL 25 MG PO TABS: 25.0000 mg | ORAL_TABLET | Freq: Four times a day (QID) | ORAL | 5 refills | Status: DC | PRN

## 2018-11-11 ENCOUNTER — Telehealth: Payer: Self-pay | Admitting: Family Medicine

## 2018-11-11 DIAGNOSIS — F418 Other specified anxiety disorders: Secondary | ICD-10-CM

## 2018-11-12 MED ORDER — SERTRALINE HCL 50 MG PO TABS: 50.0000 mg | ORAL_TABLET | Freq: Every day | ORAL | 0 refills | Status: DC

## 2018-11-12 NOTE — Telephone Encounter (Signed)
Patient called to get the status of her refill for her medication sertraline (ZOLOFT) 50 MG tablet.  Patient stated that she is all out and would like it filled as soon as possible.  CB# [phone]

## 2018-11-12 NOTE — Addendum Note (Signed)
Addended by: Marcellus Scott on: 11/12/2018 03:26 PM   Modules accepted: Orders

## 2018-11-13 ENCOUNTER — Telehealth: Payer: Self-pay | Admitting: Family Medicine

## 2018-11-13 NOTE — Telephone Encounter (Signed)
Received a call from the [facility] stating that the pt is needing a refill of the sumatriptan but this has not been filled since 2018.  Advised that we would have to check with Dr. Clent Ridges first and that it would be tomorrow before this would be addressed. [facility] stated that they would advise the pt of this. Dr. Clent Ridges please advise. Thanks

## 2018-11-14 MED ORDER — SUMATRIPTAN SUCCINATE 100 MG PO TABS: 100.0000 mg | ORAL_TABLET | ORAL | 11 refills | Status: DC | PRN

## 2018-11-14 NOTE — Telephone Encounter (Signed)
rx has been sent to the pharmacy per pts request.

## 2018-11-14 NOTE — Telephone Encounter (Signed)
Call in Sumatriptan 100 mg to use prn, #10 with 11 rf

## 2019-08-24 DIAGNOSIS — Z23 Encounter for immunization: Secondary | ICD-10-CM | POA: Diagnosis not present

## 2019-12-10 ENCOUNTER — Ambulatory Visit: Payer: Self-pay | Attending: Internal Medicine

## 2019-12-10 DIAGNOSIS — Z23 Encounter for immunization: Secondary | ICD-10-CM

## 2019-12-10 NOTE — Progress Notes (Signed)
   Covid-19 Vaccination Clinic  Name:  QIANA LANDGREBE    MRN: [id number] DOB: [birth year]  12/10/2019  Ms. Arth was observed post Covid-19 immunization for 15 minutes without incident. She was provided with Vaccine Information Sheet and instruction to access the V-Safe system.   Ms. Funnell was instructed to call 911 with any severe reactions post vaccine: Marland Kitchen Difficulty breathing  . Swelling of face and throat  . A fast heartbeat  . A bad rash all over body  . Dizziness and weakness   Immunizations Administered    Name Date Dose VIS Date Route   Pfizer COVID-19 Vaccine 12/10/2019 12:48 PM 0.3 mL 08/21/2019 Intramuscular   Manufacturer: [employer], [employer]   Lot: [id number]   NDC: [address]-1

## 2020-01-04 ENCOUNTER — Ambulatory Visit: Payer: Self-pay | Attending: Internal Medicine

## 2020-01-04 DIAGNOSIS — Z23 Encounter for immunization: Secondary | ICD-10-CM

## 2020-01-04 NOTE — Progress Notes (Signed)
   Covid-19 Vaccination Clinic  Name:  Kristine Sims    MRN: [id number] DOB: Mar 16, 1971  01/04/2020  Kristine Sims was observed post Covid-19 immunization for 15 minutes without incident. She was provided with Vaccine Information Sheet and instruction to access the V-Safe system.   Kristine Sims was instructed to call 911 with any severe reactions post vaccine: Marland Kitchen Difficulty breathing  . Swelling of face and throat  . A fast heartbeat  . A bad rash all over body  . Dizziness and weakness   Immunizations Administered    Name Date Dose VIS Date Route   Pfizer COVID-19 Vaccine 01/04/2020 12:18 PM 0.3 mL 11/04/2018 Intramuscular   Manufacturer: [employer], [employer]   Lot: [id number]   NDC: [address]-9

## 2020-02-08 ENCOUNTER — Other Ambulatory Visit: Payer: Self-pay | Admitting: Family Medicine

## 2020-02-11 ENCOUNTER — Other Ambulatory Visit: Payer: Self-pay | Admitting: Family Medicine

## 2020-02-11 NOTE — Telephone Encounter (Signed)
Pt states she will be out of town starting Saturday and needs this refilled before then. Pt is aware that PCP has half days on Thursdays and will not recieve message until tomorrow.   Medication Refill:  Sumatriptan  Pharmacy: [facility] FAX: [phone]

## 2020-02-12 ENCOUNTER — Telehealth: Payer: Self-pay | Admitting: Family Medicine

## 2020-02-12 NOTE — Telephone Encounter (Signed)
SUMAtriptan (IMITREX) 100 MG tablet  [id number] - [place], [place] - [address] Phone:  [phone]  Fax:  [phone]     She is on her last pill today and is going out of town tomorrow.

## 2020-02-12 NOTE — Telephone Encounter (Signed)
Patient needs a physical for further refills.  Left message for patient to call back and schedule an appointment.

## 2020-06-01 ENCOUNTER — Other Ambulatory Visit: Payer: Self-pay

## 2020-06-02 ENCOUNTER — Other Ambulatory Visit: Payer: Self-pay

## 2020-06-02 ENCOUNTER — Encounter: Payer: Self-pay | Admitting: Internal Medicine

## 2020-06-02 ENCOUNTER — Ambulatory Visit: Payer: BC Managed Care – PPO | Admitting: Internal Medicine

## 2020-06-02 VITALS — BP 150/90 | HR 118 | Temp 98.6°F | Wt 205.8 lb

## 2020-06-02 DIAGNOSIS — R200 Anesthesia of skin: Secondary | ICD-10-CM

## 2020-06-02 DIAGNOSIS — F418 Other specified anxiety disorders: Secondary | ICD-10-CM | POA: Diagnosis not present

## 2020-06-02 DIAGNOSIS — R202 Paresthesia of skin: Secondary | ICD-10-CM

## 2020-06-02 MED ORDER — SUMATRIPTAN SUCCINATE 100 MG PO TABS: 100.0000 mg | ORAL_TABLET | ORAL | 11 refills | Status: DC | PRN

## 2020-06-02 MED ORDER — PROMETHAZINE HCL 25 MG PO TABS: 25.0000 mg | ORAL_TABLET | Freq: Four times a day (QID) | ORAL | 5 refills | Status: DC | PRN

## 2020-06-02 NOTE — Progress Notes (Signed)
Acute office Visit     This visit occurred during the SARS-CoV-2 public health emergency.  Safety protocols were in place, including screening questions prior to the visit, additional usage of staff PPE, and extensive cleaning of exam room while observing appropriate contact time as indicated for disinfecting solutions.    CC/Reason for Visit: Tingling  HPI: Kristine Sims is a 49 y.o. female who is coming in today for the above mentioned reasons.  She is a patient of Dr. Clent Ridges, here today to discuss some acute/subacute complaints.  In June she started feeling that her lower lip was tingling.  It has now progressed to tingling of her bilateral feet and hands.  The tingling has progressed to numbness and a feeling of "electric needles" all over her legs.  She is extremely anxious today and is trembling.  She states she has not slept in over 3 days because of this pain.  She states that she was diagnosed with neuropathy after her back surgery and had good response to gabapentin at one point.   Past Medical/Surgical History: Past Medical History:  Diagnosis Date  . Acute recurrent maxillary sinusitis    frequent, sees Dr. Narda Bonds  . Allergy   . Anxiety   . Asthma attack 09/07/2011  . Depression   . IBS (irritable bowel syndrome)   . Migraines   . Peripheral neuropathy    per Dr. Dian Situ and Dr. Vear Clock    Past Surgical History:  Procedure Laterality Date  . CESAREAN SECTION    . LUMBAR DISC SURGERY    . NASAL SEPTUM SURGERY      Social History:  reports that she has never smoked. She has never used smokeless tobacco. She reports current alcohol use of about 14.0 standard drinks of alcohol per week. She reports that she does not use drugs.  Allergies: Allergies  Allergen Reactions  . Amoxicillin Swelling  . Augmentin [Amoxicillin-Pot Clavulanate] Swelling  . Oxycodone-Acetaminophen     Family History:  Family History  Problem Relation Age of Onset  .  Hypertension Unknown   . Heart disease Unknown   . Cancer Mother        lung cancer  . Hypertension Father   . Heart disease Father   . Hyperlipidemia Brother   . Hypertension Brother   . Mental illness Sister        depression  . Hypertension Brother   . Asperger's syndrome Son   . ADD / ADHD Son      Current Outpatient Medications:  .  levonorgestrel (MIRENA) 20 MCG/24HR IUD, 1 each by Intrauterine route once., Disp: , Rfl:  .  promethazine (PHENERGAN) 25 MG tablet, Take 1 tablet (25 mg total) by mouth every 6 (six) hours as needed. for nausea, Disp: 40 tablet, Rfl: 5 .  SUMAtriptan (IMITREX) 100 MG tablet, Take 1 tablet (100 mg total) by mouth every 2 (two) hours as needed for migraine. May repeat in 2 hours if headache persists or recurs., Disp: 10 tablet, Rfl: 11 .  cyclobenzaprine (FLEXERIL) 10 MG tablet, Take 1 tablet (10 mg total) by mouth 3 (three) times daily as needed for muscle spasms. (Patient not taking: Reported on 06/02/2020), Disp: 60 tablet, Rfl: 1 .  diazepam (VALIUM) 5 MG tablet, TAKE 1 TABLET BY MOUTH EVERY 8 HOURS AS NEEDED (Patient not taking: Reported on 06/02/2020), Disp: 60 tablet, Rfl: 2 .  dicyclomine (BENTYL) 20 MG tablet, TAKE 1 TABLET (20 MG TOTAL) BY MOUTH  3 (THREE) TIMES DAILY BEFORE MEALS. (Patient not taking: Reported on 06/02/2020), Disp: 90 tablet, Rfl: 0 .  HYDROcodone-homatropine (HYDROMET) 5-1.5 MG/5ML syrup, Take 5 mLs by mouth every 4 (four) hours as needed. (Patient not taking: Reported on 06/02/2020), Disp: 240 mL, Rfl: 0 .  sertraline (ZOLOFT) 50 MG tablet, Take 1 tablet (50 mg total) by mouth daily. (Patient not taking: Reported on 06/02/2020), Disp: 90 tablet, Rfl: 0  Review of Systems:  Constitutional: Denies fever, chills, diaphoresis, appetite change. HEENT: Denies photophobia, eye pain, redness, hearing loss, ear pain, congestion, sore throat, rhinorrhea, sneezing, mouth sores, trouble swallowing, neck pain, neck stiffness and tinnitus.     Respiratory: Denies SOB, DOE, cough, chest tightness,  and wheezing.   Cardiovascular: Denies chest pain, palpitations and leg swelling.  Gastrointestinal: Denies nausea, vomiting, abdominal pain, diarrhea, constipation, blood in stool and abdominal distention.  Genitourinary: Denies dysuria, urgency, frequency, hematuria, flank pain and difficulty urinating.  Endocrine: Denies: hot or cold intolerance, sweats, changes in hair or nails, polyuria, polydipsia. Musculoskeletal: Denies myalgias, back pain, joint swelling, arthralgias and gait problem.  Skin: Denies pallor, rash and wound.  Neurological: Denies dizziness, seizures, syncope, weakness, light-headedness and headaches.  Hematological: Denies adenopathy. Easy bruising, personal or family bleeding history  Psychiatric/Behavioral: Denies suicidal ideation, nervousness, sleep disturbance and agitation    Physical Exam: Vitals:   06/02/20 0810  BP: (!) 150/90  Pulse: (!) 118  Temp: 98.6 F (37 C)  TempSrc: Oral  SpO2: 96%  Weight: 205 lb 12.8 oz (93.4 kg)    Body mass index is 36.17 kg/m.   Constitutional: NAD, calm, comfortable Eyes: PERRL, lids and conjunctivae normal, wears corrective lenses ENMT: Mucous membranes are moist.  Respiratory: clear to auscultation bilaterally, no wheezing, no crackles. Normal respiratory effort. No accessory muscle use.  Cardiovascular: Regular rate and rhythm, no murmurs / rubs / gallops. No extremity edema, good pedal pulses Psychiatric: Normal judgment and insight. Alert and oriented x 3.  Anxious, trembling.  Impression and Plan:  Numbness and tingling of both feet Numbness and tingling in both hands -Suspicious for vitamin B12 deficiency.  Will also check vitamin D and TSH. -If labs are normal, consider placing her on gabapentin as she states she had a good response to this in the past. -Follow-up with Dr. Clent Ridges in 3 to 4 weeks.  Depression with anxiety -Requesting refills of:   promethazine (PHENERGAN) 25 MG tablet, SUMAtriptan (IMITREX) 100 MG tablet    Patient Instructions  -Nice seeing you today!!  -Lab work today; will notify you once results are available.  -Schedule follow up with Dr. Clent Ridges in 2-3 months.     Chaya Jan, MD [facility]

## 2020-06-02 NOTE — Patient Instructions (Signed)
-  Nice seeing you today!!  -Lab work today; will notify you once results are available.  -Schedule follow up with Dr. Clent Ridges in 2-3 months.

## 2020-06-03 LAB — VITAMIN B12: Vitamin B-12: 272 pg/mL (ref 200–1100)

## 2020-06-03 LAB — VITAMIN D 25 HYDROXY (VIT D DEFICIENCY, FRACTURES): Vit D, 25-Hydroxy: 11 ng/mL — ABNORMAL LOW (ref 30–100)

## 2020-06-03 LAB — TSH: TSH: 5.88 mIU/L — ABNORMAL HIGH

## 2020-06-07 ENCOUNTER — Telehealth: Payer: Self-pay | Admitting: Family Medicine

## 2020-06-07 NOTE — Telephone Encounter (Signed)
Patient returning Kristine Sims's call for results

## 2020-06-08 NOTE — Telephone Encounter (Signed)
Patient is aware See lab result note 

## 2020-06-09 ENCOUNTER — Other Ambulatory Visit: Payer: Self-pay

## 2020-06-09 ENCOUNTER — Telehealth: Payer: Self-pay | Admitting: Family Medicine

## 2020-06-09 DIAGNOSIS — R7989 Other specified abnormal findings of blood chemistry: Secondary | ICD-10-CM

## 2020-06-09 MED ORDER — VITAMIN D (ERGOCALCIFEROL) 1.25 MG (50000 UNIT) PO CAPS: 50000.0000 [IU] | ORAL_CAPSULE | ORAL | 3 refills | Status: AC

## 2020-06-09 NOTE — Telephone Encounter (Signed)
Done

## 2020-06-10 ENCOUNTER — Telehealth: Payer: Self-pay | Admitting: Family Medicine

## 2020-06-10 MED ORDER — GABAPENTIN 300 MG PO CAPS: 300.0000 mg | ORAL_CAPSULE | Freq: Three times a day (TID) | ORAL | 3 refills | Status: DC

## 2020-06-10 NOTE — Telephone Encounter (Signed)
-----   Message from Franchot Erichsen, [place] sent at 06/09/2020  9:38 AM EDT ----- Results and recommendations were given to the patient.  Lab appt. scheduled. Rx sent to the requested pharmacy.  Patient is asking about her neuropathic pain. She states she is only sleeping about 2 hours a night and is in her "own personal hell." She saw Dr. Ardyth Harps 06/02/20 and her recommendation was to try gabapentin after labs, as this had helped the patient in the past.

## 2020-06-10 NOTE — Telephone Encounter (Signed)
Patient called, left detailed VM of Dr. Claris Che recommendations below about Gabapentin. Advised to return the call if any questions.

## 2020-06-10 NOTE — Telephone Encounter (Signed)
I sent in some Gabapentin so she can get started on this

## 2020-06-13 ENCOUNTER — Other Ambulatory Visit: Payer: Self-pay

## 2020-06-13 ENCOUNTER — Telehealth: Payer: Self-pay

## 2020-06-13 NOTE — Telephone Encounter (Addendum)
While working at [employer] on 9/30 I scheduled a lab appointment for this patient for 10/5 for a T3 and T4. I mistakenly scheduled it for [facility]. I called patient about 3 PM this afternoon to let her know that I had incorrectly scheduled her labs and told her to call [facility] to reschedule the appointment. Her appointment was scheduled for 9:30 and there is a 9:40 lab opening at [facility].   I called [facility] and let them know the above and see if they could schedule at the approximate time I had scheduled them at [facility]. Patient had called [facility] to schedule her appointment for 9:20 tomorrow.

## 2020-06-14 ENCOUNTER — Other Ambulatory Visit (INDEPENDENT_AMBULATORY_CARE_PROVIDER_SITE_OTHER): Payer: BC Managed Care – PPO

## 2020-06-14 ENCOUNTER — Other Ambulatory Visit: Payer: Self-pay

## 2020-06-14 DIAGNOSIS — R7989 Other specified abnormal findings of blood chemistry: Secondary | ICD-10-CM

## 2020-06-14 LAB — T4, FREE: Free T4: 1.3 ng/dL (ref 0.8–1.8)

## 2020-06-14 LAB — T3, FREE: T3, Free: 2.7 pg/mL (ref 2.3–4.2)

## 2020-06-18 ENCOUNTER — Encounter: Payer: Self-pay | Admitting: Family Medicine

## 2020-06-22 NOTE — Telephone Encounter (Signed)
She will need an OV with me to talk about all this

## 2020-06-22 NOTE — Telephone Encounter (Signed)
Patient called, left VM to call and schedule an appointment to discuss muscle weakness, per Dr. Clent Ridges.

## 2020-06-23 ENCOUNTER — Other Ambulatory Visit: Payer: Self-pay

## 2020-06-23 NOTE — Telephone Encounter (Signed)
Pt called and was scheduled for Friday 06/24/2020

## 2020-06-24 ENCOUNTER — Ambulatory Visit: Payer: BC Managed Care – PPO | Admitting: Family Medicine

## 2020-06-24 ENCOUNTER — Encounter: Payer: Self-pay | Admitting: Family Medicine

## 2020-06-24 VITALS — BP 168/84 | HR 102 | Temp 98.9°F | Ht 63.0 in | Wt 212.2 lb

## 2020-06-24 DIAGNOSIS — G609 Hereditary and idiopathic neuropathy, unspecified: Secondary | ICD-10-CM

## 2020-06-24 MED ORDER — GABAPENTIN 300 MG PO CAPS: 600.0000 mg | ORAL_CAPSULE | Freq: Three times a day (TID) | ORAL | 5 refills | Status: DC

## 2020-06-24 NOTE — Progress Notes (Signed)
   Subjective:    Patient ID: Kristine Sims, female    DOB: [birth year], 49 y.o.   MRN: [id number]  HPI Here with her husband to follow up on a rapidly progressing polyneuropathy. This started in June when she noticed some numbness and tingling in her lower lip. Then a few weeks later she developed numbness and tingling in both hands and both feet. The tingling then progressed to the point it was painful, and she has burning pains in the hands and feet. She saw Dr. Ardyth Harps on 06-02-20 and she had some labs drawn which were unremarkable. She was started on Gabapentin 300 mg TID, and this has helped with the pain somewhat. Since then she has also developed tremors and shakes in there hands which makes writing and feeding herself difficult. There is also now weakness in the arms and hands and legs, and she has trouble walking. She is unsteady on her feet and she has to hold onto things. She has fallen twice at home. There is no family hx of neurologic diseases.    Review of Systems  Constitutional: Positive for fatigue.  Respiratory: Negative.   Cardiovascular: Negative.   Gastrointestinal: Negative.   Genitourinary: Negative.   Musculoskeletal: Positive for gait problem.  Neurological: Positive for tremors, weakness and numbness. Negative for seizures and speech difficulty.       Objective:   Physical Exam Constitutional:      Appearance: She is obese.     Comments: She is very shaky when sitting and even more so when standing. She has trouble walking and has to hold onto something to keep from falling   Cardiovascular:     Rate and Rhythm: Normal rate and regular rhythm.     Pulses: Normal pulses.     Heart sounds: Normal heart sounds.  Pulmonary:     Effort: Pulmonary effort is normal.     Breath sounds: Normal breath sounds.  Lymphadenopathy:     Cervical: No cervical adenopathy.  Neurological:     Mental Status: She is alert and oriented to person, place, and time.     Comments:  She has resting tremors in both hands            Assessment & Plan:  Polyneuropathy which is rapidly progressing. We will get some more labs today to look for an etiology. I wote a rx for her to get a walker to use around the house. We will increase the Gabapentin to 600 mg TID. We will refer her to Neurology ASAP to check for demyelinating disorders, etc.  Gershon Crane, MD

## 2020-06-25 LAB — HEPATIC FUNCTION PANEL
AG Ratio: 1.3 (calc) (ref 1.0–2.5)
ALT: 43 U/L — ABNORMAL HIGH (ref 6–29)
AST: 139 U/L — ABNORMAL HIGH (ref 10–35)
Albumin: 4.3 g/dL (ref 3.6–5.1)
Alkaline phosphatase (APISO): 92 U/L (ref 37–153)
Bilirubin, Direct: 0.2 mg/dL (ref 0.0–0.2)
Globulin: 3.3 g/dL (calc) (ref 1.9–3.7)
Indirect Bilirubin: 0.5 mg/dL (calc) (ref 0.2–1.2)
Total Bilirubin: 0.7 mg/dL (ref 0.2–1.2)
Total Protein: 7.6 g/dL (ref 6.1–8.1)

## 2020-06-25 LAB — T4, FREE: Free T4: 1.2 ng/dL (ref 0.8–1.8)

## 2020-06-25 LAB — CBC WITH DIFFERENTIAL/PLATELET
Absolute Monocytes: 845 cells/uL (ref 200–950)
Basophils Absolute: 71 cells/uL (ref 0–200)
Basophils Relative: 1 %
Eosinophils Absolute: 149 cells/uL (ref 15–500)
Eosinophils Relative: 2.1 %
HCT: 35.9 % (ref 35.0–45.0)
Hemoglobin: 12.9 g/dL (ref 11.7–15.5)
Lymphs Abs: 2329 cells/uL (ref 850–3900)
MCH: 37.7 pg — ABNORMAL HIGH (ref 27.0–33.0)
MCHC: 35.9 g/dL (ref 32.0–36.0)
MCV: 105 fL — ABNORMAL HIGH (ref 80.0–100.0)
MPV: 9.9 fL (ref 7.5–12.5)
Monocytes Relative: 11.9 %
Neutro Abs: 3706 cells/uL (ref 1500–7800)
Neutrophils Relative %: 52.2 %
Platelets: 185 10*3/uL (ref 140–400)
RBC: 3.42 10*6/uL — ABNORMAL LOW (ref 3.80–5.10)
RDW: 13.4 % (ref 11.0–15.0)
Total Lymphocyte: 32.8 %
WBC: 7.1 10*3/uL (ref 3.8–10.8)

## 2020-06-25 LAB — BASIC METABOLIC PANEL
BUN/Creatinine Ratio: 12 (calc) (ref 6–22)
BUN: 5 mg/dL — ABNORMAL LOW (ref 7–25)
CO2: 23 mmol/L (ref 20–32)
Calcium: 9.5 mg/dL (ref 8.6–10.4)
Chloride: 102 mmol/L (ref 98–110)
Creat: 0.43 mg/dL — ABNORMAL LOW (ref 0.50–1.05)
Glucose, Bld: 93 mg/dL (ref 65–99)
Potassium: 4 mmol/L (ref 3.5–5.3)
Sodium: 141 mmol/L (ref 135–146)

## 2020-06-25 LAB — TSH: TSH: 5.85 mIU/L — ABNORMAL HIGH

## 2020-06-25 LAB — FOLATE: Folate: 9.7 ng/mL

## 2020-06-25 LAB — T3, FREE: T3, Free: 2.5 pg/mL (ref 2.3–4.2)

## 2020-06-27 ENCOUNTER — Encounter: Payer: Self-pay | Admitting: Neurology

## 2020-07-07 ENCOUNTER — Encounter: Payer: Self-pay | Admitting: Neurology

## 2020-07-07 ENCOUNTER — Other Ambulatory Visit: Payer: Self-pay

## 2020-07-07 ENCOUNTER — Ambulatory Visit (INDEPENDENT_AMBULATORY_CARE_PROVIDER_SITE_OTHER): Payer: BC Managed Care – PPO | Admitting: Neurology

## 2020-07-07 ENCOUNTER — Other Ambulatory Visit (INDEPENDENT_AMBULATORY_CARE_PROVIDER_SITE_OTHER): Payer: BC Managed Care – PPO

## 2020-07-07 VITALS — BP 165/96 | HR 105 | Ht 63.0 in | Wt 213.0 lb

## 2020-07-07 DIAGNOSIS — R239 Unspecified skin changes: Secondary | ICD-10-CM

## 2020-07-07 DIAGNOSIS — G6289 Other specified polyneuropathies: Secondary | ICD-10-CM | POA: Diagnosis not present

## 2020-07-07 DIAGNOSIS — K5900 Constipation, unspecified: Secondary | ICD-10-CM | POA: Diagnosis not present

## 2020-07-07 LAB — C-REACTIVE PROTEIN: CRP: 2.1 mg/dL (ref 0.5–20.0)

## 2020-07-07 LAB — SEDIMENTATION RATE: Sed Rate: 76 mm/hr — ABNORMAL HIGH (ref 0–30)

## 2020-07-07 MED ORDER — GABAPENTIN 600 MG PO TABS: 900.0000 mg | ORAL_TABLET | Freq: Three times a day (TID) | ORAL | 1 refills | Status: DC

## 2020-07-07 NOTE — Progress Notes (Signed)
[facility] Clinic Note - Initial Visit   Date: 07/07/20  Kristine Sims MRN: [id number] DOB: [birth year]   Dear Dr. Sarajane Jews:  Thank you for your kind referral of Kristine Sims for consultation of neuropathy. Although her history is well known to you, please allow [place] to reiterate it for the purpose of our medical record. The patient was accompanied to the clinic by husband who also provides collateral information.     History of Present Illness: Kristine Sims is a 49 y.o. right-handed female with migraines, anxiety, s/p lumbar surgery x 2 presenting for urgent evaluation of progressive neuropathy.   Starting in June 2021, she woke up with swelling and tingling involving her lower lip.  Later that day, she had tingling of the toes, which has progressed into her lower legs up to the level of the knees and hands below the wrist. She has some generalized weakness.  Around September, she began noticing numbness/tingling and stabbing pain in the hands. She also reports imbalance and has had two falls.  She started using a cane earlier this month. Stabbing pain has improved after starting gabapentin 659m three times daily. She would like to increase the dose.   Husband also reports mild forgetfulness over the past few months.  She is able to do her usual ADLs and IADLs except for driving because of numbness in the feet.   She was previously working as a sOceanographerprior to the pandemic.  She lives in a 2-level home with husband and two children.    She does not smoke, occasionally drinks alcohol.   Prior labs show mild transaminitis, elevated MCV, low-normal vitmain B12, normal folate.   Out-side paper records, electronic medical record, and images have been reviewed where available and summarized as:  Labs 06/24/2020:  Folate 9.7,   Lab Results  Component Value Date   ALT 43 (H) 06/24/2020   AST 139 (H) 06/24/2020   ALKPHOS 34 (L) 10/26/2008   BILITOT 0.7  06/24/2020    No results found for: HGBA1C Lab Results  Component Value Date   VITAMINB12 272 06/02/2020   Lab Results  Component Value Date   TSH 5.85 (H) 06/24/2020     Past Medical History:  Diagnosis Date  . Acute recurrent maxillary sinusitis    frequent, sees Dr. CRadene Journey . Allergy   . Anxiety   . Asthma attack 09/07/2011  . Depression   . IBS (irritable bowel syndrome)   . Migraines   . Peripheral neuropathy    per Dr. MTimmothy Soursand Dr. PHardin Negus   Past Surgical History:  Procedure Laterality Date  . CESAREAN SECTION    . LUMBAR DISC SURGERY    . NASAL SEPTUM SURGERY       Medications:  Outpatient Encounter Medications as of 07/07/2020  Medication Sig  . gabapentin (NEURONTIN) 300 MG capsule Take 2 capsules (600 mg total) by mouth 3 (three) times daily.  .Marland Kitchenlevonorgestrel (MIRENA) 20 MCG/24HR IUD 1 each by Intrauterine route once.  . promethazine (PHENERGAN) 25 MG tablet Take 1 tablet (25 mg total) by mouth every 6 (six) hours as needed. for nausea  . SUMAtriptan (IMITREX) 100 MG tablet Take 1 tablet (100 mg total) by mouth every 2 (two) hours as needed for migraine. May repeat in 2 hours if headache persists or recurs.  . Vitamin D, Ergocalciferol, (DRISDOL) 1.25 MG (50000 UNIT) CAPS capsule Take 1 capsule (50,000 Units total) by mouth every 7 (  seven) days.  . cyclobenzaprine (FLEXERIL) 10 MG tablet Take 1 tablet (10 mg total) by mouth 3 (three) times daily as needed for muscle spasms. (Patient not taking: Reported on 06/02/2020)  . diazepam (VALIUM) 5 MG tablet TAKE 1 TABLET BY MOUTH EVERY 8 HOURS AS NEEDED (Patient not taking: Reported on 06/02/2020)  . dicyclomine (BENTYL) 20 MG tablet TAKE 1 TABLET (20 MG TOTAL) BY MOUTH 3 (THREE) TIMES DAILY BEFORE MEALS. (Patient not taking: Reported on 06/02/2020)  . HYDROcodone-homatropine (HYDROMET) 5-1.5 MG/5ML syrup Take 5 mLs by mouth every 4 (four) hours as needed. (Patient not taking: Reported on 06/02/2020)  .  sertraline (ZOLOFT) 50 MG tablet Take 1 tablet (50 mg total) by mouth daily. (Patient not taking: Reported on 06/02/2020)   No facility-administered encounter medications on file as of 07/07/2020.    Allergies:  Allergies  Allergen Reactions  . Amoxicillin Swelling  . Augmentin [Amoxicillin-Pot Clavulanate] Swelling  . Oxycodone-Acetaminophen     Family History: Family History  Problem Relation Age of Onset  . Hypertension Other   . Heart disease Other   . Cancer Mother        lung cancer  . Hypertension Father   . Heart disease Father   . Hyperlipidemia Brother   . Hypertension Brother   . Mental illness Sister        depression  . Hypertension Brother   . Asperger's syndrome Son   . ADD / ADHD Son     Social History: Social History   Tobacco Use  . Smoking status: Never Smoker  . Smokeless tobacco: Never Used  Vaping Use  . Vaping Use: Never used  Substance Use Topics  . Alcohol use: Yes    Alcohol/week: 14.0 standard drinks    Types: 14 Standard drinks or equivalent per week    Comment: increased from occasionally to cope with anxiety, 2015  . Drug use: No   Social History   Social History Narrative   Lives with her husband and two sons.   Her younger brother has disconnected from the family for unknown reasons, in 2005.   Right Handed   Lives in a two story home.    Drinks no caffeine     Vital Signs:  BP (!) 165/96   Pulse (!) 105   Ht _0  (1.6 m)   Wt 213 lb (96.6 kg)   SpO2 94%   BMI 37.73 kg/m    Neurological Exam: MENTAL STATUS including orientation to time, place, person, recent and remote memory, attention span and concentration, language, and fund of knowledge is normal.  Speech is not dysarthric.  CRANIAL NERVES: II:  No visual field defects.    III-IV-VI: Pupils equal round and reactive.  Normal conjugate, extra-ocular eye movements in all directions of gaze.  No nystagmus.  No ptosis.   VIII:  Normal hearing and vestibular  function.    XI:  Normal shoulder shrug and head rotation.    MOTOR:  Bilateral hand tremors, worse with finger-to-nose testing.  No atrophy or fasciculations.  No pronator drift.   Upper Extremity:  Right  Left  Deltoid  5/5   5/5   Biceps  5/5   5/5   Triceps  5/5   5/5   Infraspinatus 5/5  5/5  Medial pectoralis 5/5  5/5  Wrist extensors  5/5   5/5   Wrist flexors  5/5   5/5   Finger extensors  5/5   5/5   Finger flexors  5/5   5/5   Dorsal interossei  5/5   5/5   Abductor pollicis  5/5   5/5   Tone (Ashworth scale)  0  0   Lower Extremity:  Right  Left  Hip flexors  5/5   5/5   Hip extensors  5/5   5/5   Adductor 5/5  5/5  Abductor 5/5  5/5  Knee flexors  5/5   5/5   Knee extensors  5/5   5/5   Dorsiflexors  5-/5   5-/5   Plantarflexors  5-/5   5-/5   Toe extensors  4/5   4/5   Toe flexors  5-/5   5-/5   Tone (Ashworth scale)  0  0   MSRs:  Right        Left                  brachioradialis 2+  2+  biceps 2+  2+  triceps 2+  2+  patellar 2+  2+  ankle jerk 1+  0  Hoffman no  no  plantar response down  down   SENSORY: Vibration is absent at the ankles bilaterally, temperature and pin prick reduced over the left foot, sensation intact in the arms.  Modified Rhomberg sign is positive.   COORDINATION/GAIT: Normal finger-to- nose-finger.  Intact rapid alternating movements bilaterally.  Unable to rise from a chair without using arms.  Gait is very wide-based, assisted with cane, ataxic.   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  Need to evaluate for CIDP, DADS, or other autoimmune/metabolic neuropathy  PLAN/RECOMMENDATIONS:  Check ESR, CRP, ANA, SSA/B, ANCA, cryoglobulin, celiac disease, SPEP with IFE, copper, vitamin B1, MMA Nerve testing of the left arm and leg Discussed possibility of CSF testing going forward Increase gabapentin 924m three times daily No  driving Patient educated on daily foot inspection, fall prevention, and safety precautions around the home. PT declined  Further recommendations pending results.  Total time spent reviewing records, interview, history/exam, counseling, documentation, and coordination of care on day of encounter:  60 min     Thank you for allowing me to participate in patient's care.  If I can answer any additional questions, I would be pleased to do so.    Sincerely,    Tyshawn Keel K. PPosey Pronto DO

## 2020-07-07 NOTE — Patient Instructions (Addendum)
Check labs  Nerve testing of the left arm and leg  If you choose to start physical therapy, please call my office  Increase gabapentin to 900mg  three times daily  Start using a walker to provide more stability when walking

## 2020-07-13 ENCOUNTER — Ambulatory Visit: Payer: BC Managed Care – PPO | Admitting: Neurology

## 2020-07-13 ENCOUNTER — Other Ambulatory Visit: Payer: Self-pay

## 2020-07-13 DIAGNOSIS — K5900 Constipation, unspecified: Secondary | ICD-10-CM

## 2020-07-13 DIAGNOSIS — G6289 Other specified polyneuropathies: Secondary | ICD-10-CM

## 2020-07-13 DIAGNOSIS — G629 Polyneuropathy, unspecified: Secondary | ICD-10-CM | POA: Diagnosis not present

## 2020-07-13 DIAGNOSIS — R239 Unspecified skin changes: Secondary | ICD-10-CM

## 2020-07-13 LAB — ANTI-NUCLEAR AB-TITER (ANA TITER): ANA Titer 1: 1:80 {titer} — ABNORMAL HIGH

## 2020-07-13 LAB — COPPER, SERUM: Copper: 162 ug/dL (ref 70–175)

## 2020-07-13 LAB — IMMUNOFIXATION ELECTROPHORESIS
IgG (Immunoglobin G), Serum: 1385 mg/dL (ref 600–1640)
IgM, Serum: 183 mg/dL (ref 50–300)
Immunofix Electr Int: NOT DETECTED
Immunoglobulin A: 303 mg/dL (ref 47–310)

## 2020-07-13 LAB — GLIADIN ANTIBODIES, SERUM
Gliadin IgA: 1.9 U/mL
Gliadin IgG: <1 U/mL

## 2020-07-13 LAB — PROTEIN ELECTROPHORESIS, SERUM
Albumin ELP: 4.4 g/dL (ref 3.8–4.8)
Alpha 1: 0.4 g/dL — ABNORMAL HIGH (ref 0.2–0.3)
Alpha 2: 0.9 g/dL (ref 0.5–0.9)
Beta 2: 0.5 g/dL (ref 0.2–0.5)
Beta Globulin: 0.5 g/dL (ref 0.4–0.6)
Gamma Globulin: 1.3 g/dL (ref 0.8–1.7)
Total Protein: 8 g/dL (ref 6.1–8.1)

## 2020-07-13 LAB — SJOGREN'S SYNDROME ANTIBODS(SSA + SSB)
SSA (Ro) (ENA) Antibody, IgG: <1 AI
SSB (La) (ENA) Antibody, IgG: <1 AI

## 2020-07-13 LAB — ANA: Anti Nuclear Antibody (ANA): POSITIVE — AB

## 2020-07-13 LAB — ANCA SCREEN W REFLEX TITER: ANCA Screen: NEGATIVE

## 2020-07-13 LAB — CRYOGLOBULIN

## 2020-07-13 LAB — METHYLMALONIC ACID, SERUM: Methylmalonic Acid, Quant: 81 nmol/L — ABNORMAL LOW (ref 87–318)

## 2020-07-13 LAB — VITAMIN B1: Vitamin B1 (Thiamine): 12 nmol/L (ref 8–30)

## 2020-07-13 NOTE — Procedures (Signed)
[facility]  [address], [address]  [place], [place] [address] Tel: [phone] Fax:  [phone] Test Date:  07/13/2020  Patient: Kristine Sims DOB: [birth year] Physician: Nita Sickle, DO  Sex: Female Height: 5\' 3"  Ref Phys: , DO  ID#: Nita Sickle   Technician:    Patient Complaints: This is a 49 year old female referred for evaluation of generalized paresthesias of the hands and lower legs.  NCV & EMG Findings: Extensive electrodiagnostic testing of the left upper and lower extremity shows:  1. Left median sensory response shows prolonged latency (4.1 ms).  Left ulnar and radial sensory responses are within normal limits. Left sural and superficial peroneal sensory responses are absent. 2. All motor responses including the left median, ulnar, peroneal, and tibial nerves are within normal limits. 3. Left tibial H reflex study is within normal limits. 4. There is no evidence of active or chronic motor axonal loss changes affecting any of the tested muscles.  Motor unit configuration and recruitment pattern is within normal limits.  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  A superimposed left carpal tunnel syndrome cannot be excluded, correlate clinically.   ___________________________ 44, DO    Nerve Conduction Studies Anti Sensory Summary Table   Stim Site NR Peak (ms) Norm Peak (ms) P-T Amp (V) Norm P-T Amp  Left Median Anti Sensory (2nd Digit)  32C  Wrist    4.1 <3.6 30.4 >15  Left Radial Anti Sensory (Base 1st Digit)  32C  Wrist    2.0 <2.7 21.1 >14  Left Sup Peroneal Anti Sensory (Ant Lat Mall)  32C  12 cm NR  <4.6  >4  Left Sural Anti Sensory (Lat Mall)  32C  Calf NR  <4.6  >4  Left Ulnar Anti Sensory (5th Digit)  32C  Wrist    3.0 <3.1 15.6 >10   Motor Summary Table   Stim Site NR Onset (ms) Norm Onset (ms) O-P Amp (mV) Norm O-P Amp Site1  Site2 Delta-0 (ms) Dist (cm) Vel (m/s) Norm Vel (m/s)  Left Median Motor (Abd Poll Brev)  32C  Wrist    3.5 <4.0 9.5 >6 Elbow Wrist 5.0 26.0 52 >50  Elbow    8.5  8.7         Left Peroneal Motor (Ext Dig Brev)  32C  Ankle    4.6 <6.0 2.7 >2.5 B Fib Ankle 6.6 31.0 47 >40  B Fib    11.2  2.6  Poplt B Fib 1.5 8.0 53 >40  Poplt    12.7  2.2         Left Peroneal TA Motor (Tib Ant)  32C  Fib Head    2.4 <4.5 5.5 >3 Poplit Fib Head 1.3 8.0 62 >40  Poplit    3.7  5.2         Left Tibial Motor (Abd Hall Brev)  32C  Ankle    4.0 <6.0 8.6 >4 Knee Ankle 6.5 39.0 60 >40  Knee    10.5  7.0         Left Ulnar Motor (Abd Dig Minimi)  32C  Wrist    2.0 <3.1 11.6 >7 B Elbow Wrist 3.7 21.0 57 >50  B Elbow    5.7  10.6  A Elbow B Elbow 2.0 10.0 50 >50  A Elbow    7.7  10.3          H Reflex Studies  NR H-Lat (ms) Lat Norm (ms) L-R H-Lat (ms)  Left Tibial (Gastroc)  32C     31.02 <35    EMG   Side Muscle Ins Act Fibs Psw Fasc Number Recrt Dur Dur. Amp Amp. Poly Poly. Comment  Left 1stDorInt Nml Nml Nml Nml Nml Nml Nml Nml Nml Nml Nml Nml N/A  Left Abd Poll Brev Nml Nml Nml Nml Nml Nml Nml Nml Nml Nml Nml Nml N/A  Left PronatorTeres Nml Nml Nml Nml Nml Nml Nml Nml Nml Nml Nml Nml N/A  Left Biceps Nml Nml Nml Nml Nml Nml Nml Nml Nml Nml Nml Nml N/A  Left Triceps Nml Nml Nml Nml Nml Nml Nml Nml Nml Nml Nml Nml N/A  Left Deltoid Nml Nml Nml Nml Nml Nml Nml Nml Nml Nml Nml Nml N/A  Left AntTibialis Nml Nml Nml Nml Nml Nml Nml Nml Nml Nml Nml Nml N/A  Left Gastroc Nml Nml Nml Nml Nml Nml Nml Nml Nml Nml Nml Nml N/A  Left Flex Dig Long Nml Nml Nml Nml Nml Nml Nml Nml Nml Nml Nml Nml N/A  Left RectFemoris Nml Nml Nml Nml Nml Nml Nml Nml Nml Nml Nml Nml N/A  Left GluteusMed Nml Nml Nml Nml Nml Nml Nml Nml Nml Nml Nml Nml N/A  Left BicepsFemS Nml Nml Nml Nml Nml Nml Nml Nml Nml Nml Nml Nml N/A      Waveforms:

## 2020-07-13 NOTE — Progress Notes (Signed)
Follow-up Visit   Date: 07/13/20   Kristine Sims MRN: [id number] DOB: Sep 04, 1971   Interim History: Kristine Sims is a 49 y.o. right-handed Caucasian female with migraines, anxiety, and s/p lumbar surgery x 2 returning to the clinic for follow-up of progressive neuropathy.  The patient was accompanied to the clinic by husband who also provides collateral information.    History of present illness: Starting in June 2021, she woke up with swelling and tingling involving her lower lip.  Later that day, she had tingling of the toes, which has progressed into her lower legs up to the level of the knees and hands below the wrist. She has some generalized weakness.  Around September, she began noticing numbness/tingling and stabbing pain in the hands. She also reports imbalance and has had two falls.  She started using a cane earlier this month. Stabbing pain has improved after starting gabapentin 600mg  three times daily. She would like to increase the dose.   Husband also reports mild forgetfulness over the past few months.  She is able to do her usual ADLs and IADLs except for driving because of numbness in the feet.   She was previously working as a Oceanographer prior to the pandemic.  She lives in a 2-level home with husband and two children.    She does not smoke, occasionally drinks alcohol.   UPDATE 07/13/2020:  She is here for EDX testing and review labs.  Labs show elevated ESR and ANA, otherwise normal.  She continues to have numbness from the knees down into the feet and intermittent in the fingers.    Medications:  Current Outpatient Medications on File Prior to Visit  Medication Sig Dispense Refill  . cyclobenzaprine (FLEXERIL) 10 MG tablet Take 1 tablet (10 mg total) by mouth 3 (three) times daily as needed for muscle spasms. (Patient not taking: Reported on 06/02/2020) 60 tablet 1  . diazepam (VALIUM) 5 MG tablet TAKE 1 TABLET BY MOUTH EVERY 8 HOURS AS NEEDED (Patient  not taking: Reported on 06/02/2020) 60 tablet 2  . dicyclomine (BENTYL) 20 MG tablet TAKE 1 TABLET (20 MG TOTAL) BY MOUTH 3 (THREE) TIMES DAILY BEFORE MEALS. (Patient not taking: Reported on 06/02/2020) 90 tablet 0  . gabapentin (NEURONTIN) 300 MG capsule Take 2 capsules (600 mg total) by mouth 3 (three) times daily. 180 capsule 5  . gabapentin (NEURONTIN) 600 MG tablet Take 1.5 tablets (900 mg total) by mouth 3 (three) times daily. 410 tablet 1  . HYDROcodone-homatropine (HYDROMET) 5-1.5 MG/5ML syrup Take 5 mLs by mouth every 4 (four) hours as needed. (Patient not taking: Reported on 06/02/2020) 240 mL 0  . levonorgestrel (MIRENA) 20 MCG/24HR IUD 1 each by Intrauterine route once.    . promethazine (PHENERGAN) 25 MG tablet Take 1 tablet (25 mg total) by mouth every 6 (six) hours as needed. for nausea 40 tablet 5  . sertraline (ZOLOFT) 50 MG tablet Take 1 tablet (50 mg total) by mouth daily. (Patient not taking: Reported on 06/02/2020) 90 tablet 0  . SUMAtriptan (IMITREX) 100 MG tablet Take 1 tablet (100 mg total) by mouth every 2 (two) hours as needed for migraine. May repeat in 2 hours if headache persists or recurs. 10 tablet 11  . Vitamin D, Ergocalciferol, (DRISDOL) 1.25 MG (50000 UNIT) CAPS capsule Take 1 capsule (50,000 Units total) by mouth every 7 (seven) days. 12 capsule 3   No current facility-administered medications on file prior to visit.  Allergies:  Allergies  Allergen Reactions  . Amoxicillin Swelling  . Augmentin [Amoxicillin-Pot Clavulanate] Swelling  . Oxycodone-Acetaminophen     Vital Signs:  There were no vitals taken for this visit.   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  A superimposed left carpal tunnel syndrome cannot be excluded, correlate clinically.  Labs 07/07/2020:  CRP 2.1, ESR 76*, celiac antibody neg, vitamin B1 12,  SPEP no M protein, copper 162, ANCA neg, ANA positive 1:80 cytoplasmic, SSA/B neg  IMPRESSION/PLAN: Subacute sensory neuropathy as confirmed by NCS/EMG - results reviewed.  Labs show elevated ESR and mildly elevated ANA titered.  I am concerned about possible underlying immune-mediated process, so will proceed with CSF testing to evaluate for CISP.  If this returns normal, the next step will be send-out sensorimotor antibody panel through [facility].      PLAN/RECOMMENDATIONS:  Check CSF cell count and diff, protein, glucose, IgG index, oligoclonal bands, myelin basic protein, flow cytology, flow cytometry (cytospin), ACE Continue gabapentin 900mg  TID  Further recommendations pending results.  Thank you for allowing me to participate in patient's care.  If I can answer any additional questions, I would be pleased to do so.    Sincerely,    Keundra Petrucelli K. Posey Pronto, DO

## 2020-07-14 ENCOUNTER — Telehealth: Payer: Self-pay

## 2020-07-14 DIAGNOSIS — G609 Hereditary and idiopathic neuropathy, unspecified: Secondary | ICD-10-CM

## 2020-07-14 NOTE — Telephone Encounter (Signed)
Spinal Tap order created and sent to [facility].

## 2020-07-14 NOTE — Telephone Encounter (Signed)
-----   Message from Glendale Chard, DO sent at 07/13/2020  4:24 PM EDT ----- See my note for lumbar puncture orders.  Thanks.

## 2020-07-26 ENCOUNTER — Ambulatory Visit
Admission: RE | Admit: 2020-07-26 | Discharge: 2020-07-26 | Disposition: A | Discharge status: Home / Self Care | Payer: BC Managed Care – PPO | Source: Ambulatory Visit | Attending: Neurology | Admitting: Neurology

## 2020-07-26 ENCOUNTER — Other Ambulatory Visit (HOSPITAL_COMMUNITY)
Admission: RE | Admit: 2020-07-26 | Discharge: 2020-07-26 | Disposition: A | Discharge status: Home / Self Care | Payer: BC Managed Care – PPO | Source: Ambulatory Visit | Attending: Neurology | Admitting: Neurology

## 2020-07-26 DIAGNOSIS — G609 Hereditary and idiopathic neuropathy, unspecified: Secondary | ICD-10-CM | POA: Insufficient documentation

## 2020-07-26 DIAGNOSIS — G603 Idiopathic progressive neuropathy: Secondary | ICD-10-CM | POA: Diagnosis not present

## 2020-07-26 NOTE — Discharge Instructions (Signed)

## 2020-07-26 NOTE — Progress Notes (Signed)
One SST tube of blood drawn by Lorenza Chick, RN from right [facility] space for LP labs; site unremarkable.

## 2020-07-27 LAB — CYTOLOGY - NON PAP

## 2020-08-03 ENCOUNTER — Telehealth: Payer: Self-pay

## 2020-08-03 ENCOUNTER — Other Ambulatory Visit: Payer: Self-pay

## 2020-08-03 NOTE — Telephone Encounter (Signed)
Patient called in returning your call. You were not at your desk, but I did go ahead and schedule her for a follow up on 08/11/20 at 1:10p like was mentioned.

## 2020-08-03 NOTE — Telephone Encounter (Signed)
Returned patients call and informed her of results from her spinal fluid. Patient has f/u scheduled

## 2020-08-03 NOTE — Telephone Encounter (Signed)
-----   Message from Glendale Chard, DO sent at 08/03/2020  1:35 PM EST ----- Please inform pt that her spinal fluid testing is coming back looking normal. Please see if she would like to follow-up next week on 12/2 at 1:10p to go over her results and decide further steps.  Thanks.

## 2020-08-03 NOTE — Telephone Encounter (Signed)
Called patient and was unable to leave a message. No voicemail set up and line just silent.

## 2020-08-09 LAB — MYELIN BASIC PROTEIN, CSF: Myelin Basic Protein: <2 mcg/L (ref 2.0–4.0)

## 2020-08-09 LAB — LEUKEMIA/LYMPHOMA EVALUATION PANEL
NUMBER OF MARKERS:: 22
VIABILITY:: 59 %

## 2020-08-09 LAB — CNS IGG SYNTHESIS RATE, CSF+BLOOD
Albumin Serum: 4.6 g/dL (ref 3.5–5.2)
Albumin, CSF: 22.9 mg/dL (ref 8.0–42.0)
CNS-IgG Synthesis Rate: -6.7 mg/24 h (ref <=3.3)
IgG (Immunoglobin G), Serum: 1490 mg/dL (ref 600–1640)
IgG Total CSF: 3.1 mg/dL (ref 0.8–7.7)
IgG-Index: 0.42 (ref <0.66)

## 2020-08-09 LAB — CSF CELL COUNT WITH DIFFERENTIAL
RBC Count, CSF: 0 cells/uL
WBC, CSF: 0 cells/uL (ref 0–5)

## 2020-08-09 LAB — PROTEIN, CSF: Total Protein, CSF: 37 mg/dL (ref 15–45)

## 2020-08-09 LAB — OLIGOCLONAL BANDS, CSF + SERM

## 2020-08-09 LAB — GLUCOSE, CSF: Glucose, CSF: 60 mg/dL (ref 40–80)

## 2020-08-09 LAB — ANGIOTENSIN CONVERTING ENZYME, CSF: ACE, CSF: 6 U/L (ref <=15)

## 2020-08-11 ENCOUNTER — Other Ambulatory Visit: Payer: Self-pay

## 2020-08-11 ENCOUNTER — Encounter: Payer: BC Managed Care – PPO | Admitting: Neurology

## 2020-08-11 ENCOUNTER — Encounter: Payer: Self-pay | Admitting: Neurology

## 2020-08-11 ENCOUNTER — Ambulatory Visit: Payer: BC Managed Care – PPO | Admitting: Neurology

## 2020-08-11 VITALS — BP 168/109 | HR 103 | Ht 63.0 in | Wt 210.3 lb

## 2020-08-11 DIAGNOSIS — G621 Alcoholic polyneuropathy: Secondary | ICD-10-CM | POA: Diagnosis not present

## 2020-08-11 NOTE — Progress Notes (Signed)
Follow-up Visit   Date: 08/11/20   Kristine Sims MRN: [id number] DOB: Nov 07, 1970   Interim History: Kristine Sims is a 49 y.o. right-handed Caucasian female with migraine, anxiety, and s/p lumbar surgery x 2 returning to the clinic for follow-up of sensory neuropathy.  The patient was accompanied to the clinic by self.  History of present illness: Starting in June 2021, she woke up with swelling and tingling involving her lower lip. Later that day, she had tingling of the toes, which has progressed into her lower legs up to the level of the knees and hands below the wrist. She has some generalized weakness. Around September, she began noticing numbness/tingling and stabbing pain in the hands. She also reports imbalance and has had two falls. She started using a cane earlier this month. Stabbing pain has improved after starting gabapentin 600mg  three times daily. She would like to increase the dose.   Husband also reports mild forgetfulness over the past few months. She is able to do her usual ADLs and IADLs except for driving because of numbness in the feet.   She was previously working as a Oceanographer prior to the pandemic. She lives in a 2-level home with husband and two children.   UPDATE 08/11/2020:  She is here for follow-up visit to discuss CSF results.  Since her last visit, she underwent lumbar puncture which returned normal.  Today, she apologizes that she did not disclose that she was consuming 4-5 mixed drinks daily x 3 years, which became worse during the pandemic during the last visit.  She is tearful when telling me this. She realizes that this was a problem and the day following her LP, she completely stopped alcohol.  Since doing this, tingling has resolved over the face, tongue, and lower legs. She only has tingling in the feet. Her balance has improved and she uses a cane only when she gets out of bed in the morning.  No falls. She has no stabbing pain.  She  is feeling much better in general and trying to be a good example for her two teen children.  Medications:  Current Outpatient Medications on File Prior to Visit  Medication Sig Dispense Refill  . gabapentin (NEURONTIN) 600 MG tablet Take 1.5 tablets (900 mg total) by mouth 3 (three) times daily. 410 tablet 1  . levonorgestrel (MIRENA) 20 MCG/24HR IUD 1 each by Intrauterine route once.    . Multiple Vitamins-Minerals (MULTIVITAMIN WITH MINERALS) tablet Take 1 tablet by mouth daily.    . Multiple Vitamins-Minerals (VITAMIN D3 COMPLETE PO) Take by mouth.    . promethazine (PHENERGAN) 25 MG tablet Take 1 tablet (25 mg total) by mouth every 6 (six) hours as needed. for nausea 40 tablet 5  . SUMAtriptan (IMITREX) 100 MG tablet Take 1 tablet (100 mg total) by mouth every 2 (two) hours as needed for migraine. May repeat in 2 hours if headache persists or recurs. 10 tablet 11  . Vitamin D, Ergocalciferol, (DRISDOL) 1.25 MG (50000 UNIT) CAPS capsule Take 1 capsule (50,000 Units total) by mouth every 7 (seven) days. 12 capsule 3   No current facility-administered medications on file prior to visit.    Allergies:  Allergies  Allergen Reactions  . Amoxicillin Swelling  . Augmentin [Amoxicillin-Pot Clavulanate] Swelling  . Oxycodone-Acetaminophen     Vital Signs:  BP (!) 168/109   Pulse (!) 103   Ht 5\' 3"  (1.6 m)   Wt 210 lb 4.8 oz (95.4  kg)   SpO2 97%   BMI 37.25 kg/m   Neurological Exam: MENTAL STATUS including orientation to time, place, person, recent and remote memory, attention span and concentration, language, and fund of knowledge is normal.  Speech is not dysarthric.  CRANIAL NERVES:  No visual field defects.  Pupils equal round.  Normal conjugate, extra-ocular eye movements in all directions of gaze.  No ptosis   MOTOR:  Motor strength is 5/5 in all extremities, including distally.  No atrophy, fasciculations or abnormal movements.  No pronator drift.  Tone is normal.    MSRs:   Reflexes are 2+/4 throughout, except 1+ at the ankles  SENSORY:  Intact to vibration throughout, except the left great toe (improved), temperature and pin prick intact through, including the feet (improved).  Rhomberg testing is positive with standing. Marland Kitchen  COORDINATION/GAIT:  Normal finger-to- nose-finger.  Intact rapid alternating movements bilaterally.  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  A superimposed left carpal tunnel syndrome cannot be excluded, correlate clinically.  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  Results of EMG, labs, and CSF testing discussed.  Patient disclosed today that she was consuming alcohol in excess for several years and since abstaining from alcohol over the past few weeks, she has felt markedly better.  Paresthesias and sensory ataxia are significantly improved. She is no longer walking with a cane.  I am extremely pleased with her self-motivation to abstain from alcohol.   I praised patient for staying sober and encouraged her to continue to make healthy choices. Reduce gabapentin to $RemoveBefor'600mg'[id number]$  TID  Return to clinic in 4 months.   Total time spent reviewing records, interview, history/exam, documentation, counsel, and coordination of care on day of encounter:  30 min    Thank you for allowing me to participate in patient's care.  If I can answer any additional questions, I would be pleased to do so.    Sincerely,    Juel Bellerose K. Posey Pronto, DO

## 2020-08-11 NOTE — Patient Instructions (Signed)
I am so proud of you, keep up the great work!  Try to reduce gabapentin to 600mg  three times daily  Return to clinic in 4 months

## 2020-09-19 MED ORDER — DULOXETINE HCL 30 MG PO CPEP: 30.0000 mg | ORAL_CAPSULE | Freq: Every day | ORAL | 3 refills | Status: DC

## 2020-09-22 ENCOUNTER — Ambulatory Visit: Payer: BC Managed Care – PPO | Admitting: Neurology

## 2020-10-27 DIAGNOSIS — F4323 Adjustment disorder with mixed anxiety and depressed mood: Secondary | ICD-10-CM | POA: Diagnosis not present

## 2020-11-10 DIAGNOSIS — F4323 Adjustment disorder with mixed anxiety and depressed mood: Secondary | ICD-10-CM | POA: Diagnosis not present

## 2020-12-08 DIAGNOSIS — F4323 Adjustment disorder with mixed anxiety and depressed mood: Secondary | ICD-10-CM | POA: Diagnosis not present

## 2020-12-12 ENCOUNTER — Ambulatory Visit (INDEPENDENT_AMBULATORY_CARE_PROVIDER_SITE_OTHER): Payer: BC Managed Care – PPO | Admitting: Neurology

## 2020-12-12 ENCOUNTER — Other Ambulatory Visit: Payer: Self-pay

## 2020-12-12 ENCOUNTER — Encounter: Payer: Self-pay | Admitting: Neurology

## 2020-12-12 VITALS — BP 170/107 | HR 104 | Ht 63.0 in | Wt 212.0 lb

## 2020-12-12 DIAGNOSIS — G621 Alcoholic polyneuropathy: Secondary | ICD-10-CM

## 2020-12-12 NOTE — Patient Instructions (Signed)
Return to clinic in 1 year.

## 2020-12-12 NOTE — Progress Notes (Signed)
Follow-up Visit   Date: 12/12/20   Kristine Sims MRN: [id number] DOB: 12-Apr-1971   Interim History: Kristine Sims is a 50 y.o. right-handed Caucasian female with migraine, anxiety, and s/p lumbar surgery x 2 returning to the clinic for follow-up of sensory neuropathy.  The patient was accompanied to the clinic by self.  History of present illness: Starting in June 2021, she woke up with swelling and tingling involving her lower lip. Later that day, she had tingling of the toes, which has progressed into her lower legs up to the level of the knees and hands below the wrist. She has some generalized weakness. Around September, she began noticing numbness/tingling and stabbing pain in the hands. She also reports imbalance and has had two falls. She started using a cane earlier this month. Stabbing pain has improved after starting gabapentin 686m three times daily. She would like to increase the dose.   Husband also reports mild forgetfulness over the past few months. She is able to do her usual ADLs and IADLs except for driving because of numbness in the feet.   She was previously working as a sOceanographerprior to the pandemic. She lives in a 2-level home with husband and two children.   UPDATE 08/11/2020:  She is here for follow-up visit to discuss CSF results.  Since her last visit, she underwent lumbar puncture which returned normal.  Today, she apologizes that she did not disclose that she was consuming 4-5 mixed drinks daily x 3 years, which became worse during the pandemic during the last visit.  She is tearful when telling me this. She realizes that this was a problem and the day following her LP, she completely stopped alcohol.  Since doing this, tingling has resolved over the face, tongue, and lower legs. She only has tingling in the feet. Her balance has improved and she uses a cane only when she gets out of bed in the morning.  No falls. She has no stabbing pain.  She  is feeling much better in general and trying to be a good example for her two teen children.  UPDATE 12/12/2020:  She has been able to taper herself off the gabapentin and Cymbalta.  Despite this, she has not has stabbing pain.  She continues to have numbness/tingling in the soles of the feet and occasionally has "wobbly" sensation in the legs. No falls or weakness.  She is walking independently. Overall feeling great.  She has started to see her therapist and learning to meditate for stress management. She remains sober.   Medications:  Current Outpatient Medications on File Prior to Visit  Medication Sig Dispense Refill  . levonorgestrel (MIRENA) 20 MCG/24HR IUD 1 each by Intrauterine route once.    . Multiple Vitamins-Minerals (MULTIVITAMIN WITH MINERALS) tablet Take 1 tablet by mouth daily.    . Multiple Vitamins-Minerals (VITAMIN D3 COMPLETE PO) Take by mouth.    . promethazine (PHENERGAN) 25 MG tablet Take 1 tablet (25 mg total) by mouth every 6 (six) hours as needed. for nausea 40 tablet 5  . SUMAtriptan (IMITREX) 100 MG tablet Take 1 tablet (100 mg total) by mouth every 2 (two) hours as needed for migraine. May repeat in 2 hours if headache persists or recurs. 10 tablet 11  . Vitamin D, Ergocalciferol, (DRISDOL) 1.25 MG (50000 UNIT) CAPS capsule Take 1 capsule (50,000 Units total) by mouth every 7 (seven) days. 12 capsule 3  . DULoxetine (CYMBALTA) 30 MG capsule Take 1  capsule (30 mg total) by mouth daily. (Patient not taking: Reported on 12/12/2020) 30 capsule 3  . gabapentin (NEURONTIN) 600 MG tablet Take 1.5 tablets (900 mg total) by mouth 3 (three) times daily. (Patient not taking: Reported on 12/12/2020) 410 tablet 1   No current facility-administered medications on file prior to visit.    Allergies:  Allergies  Allergen Reactions  . Amoxicillin Swelling  . Augmentin [Amoxicillin-Pot Clavulanate] Swelling  . Oxycodone-Acetaminophen     Vital Signs:  BP (!) 170/107   Pulse (!) 104    Ht _0  (1.6 m)   Wt 212 lb (96.2 kg)   SpO2 94%   BMI 37.55 kg/m   Neurological Exam: MENTAL STATUS including orientation to time, place, person, recent and remote memory, attention span and concentration, language, and fund of knowledge is normal.  Speech is not dysarthric.  CRANIAL NERVES:  Normal conjugate, extra-ocular eye movements in all directions of gaze.  No ptosis   MOTOR:  Motor strength is 5/5 in all extremities, including distally. Mild tremulousness of the hands.  No atrophy, fasciculations or abnormal movements.  No pronator drift.  Tone is normal.    MSRs:  Reflexes are 2+/4 throughout, except 1+ at the ankles  SENSORY:  Intact to vibration throughout, except the left great toe (improved). Rhomberg testing is positive with standing.   COORDINATION/GAIT:  Normal finger-to- nose-finger.  Intact rapid alternating movements bilaterally.  Gait narrow based and stable, no ataxia and walking unassisted.  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  A superimposed left carpal tunnel syndrome cannot be excluded, correlate clinically.  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  With respect to pain, she has been able to stop gabapentin and cymbalta.  Balance has improved and she walks unassisted, however there is mild sensory ataxia.  I recommended using grab bars in the shower.  I am pleased to see that she has made significant changes to her lifestyle of staying sober and she is appreciating the health benefits. Patient educated on daily foot inspection, fall  prevention, and safety precautions around the home.  Return to clinic in 1 year.   Thank you for allowing me to participate in patient's care.  If I can answer any additional questions, I would be pleased to do so.    Sincerely,    Herald Vallin K. Posey Pronto, DO

## 2020-12-16 DIAGNOSIS — F4323 Adjustment disorder with mixed anxiety and depressed mood: Secondary | ICD-10-CM | POA: Diagnosis not present

## 2021-06-14 ENCOUNTER — Encounter: Payer: Self-pay | Admitting: Family Medicine

## 2021-06-14 ENCOUNTER — Other Ambulatory Visit: Payer: Self-pay

## 2021-06-14 ENCOUNTER — Ambulatory Visit (INDEPENDENT_AMBULATORY_CARE_PROVIDER_SITE_OTHER): Payer: BC Managed Care – PPO | Admitting: Family Medicine

## 2021-06-14 VITALS — BP 150/80 | HR 120 | Temp 99.5°F | Wt 218.0 lb

## 2021-06-14 DIAGNOSIS — K7031 Alcoholic cirrhosis of liver with ascites: Secondary | ICD-10-CM

## 2021-06-14 DIAGNOSIS — F1010 Alcohol abuse, uncomplicated: Secondary | ICD-10-CM | POA: Diagnosis not present

## 2021-06-14 DIAGNOSIS — G621 Alcoholic polyneuropathy: Secondary | ICD-10-CM

## 2021-06-14 DIAGNOSIS — R188 Other ascites: Secondary | ICD-10-CM

## 2021-06-14 DIAGNOSIS — R17 Unspecified jaundice: Secondary | ICD-10-CM | POA: Diagnosis not present

## 2021-06-14 DIAGNOSIS — K754 Autoimmune hepatitis: Secondary | ICD-10-CM

## 2021-06-14 LAB — CBC WITH DIFFERENTIAL/PLATELET
Basophils Absolute: 0.1 10*3/uL (ref 0.0–0.1)
Basophils Relative: 0.7 % (ref 0.0–3.0)
Eosinophils Absolute: 0.1 10*3/uL (ref 0.0–0.7)
Eosinophils Relative: 0.5 % (ref 0.0–5.0)
HCT: 34 % — ABNORMAL LOW (ref 36.0–46.0)
Hemoglobin: 11.7 g/dL — ABNORMAL LOW (ref 12.0–15.0)
Lymphocytes Relative: 11.4 % — ABNORMAL LOW (ref 12.0–46.0)
Lymphs Abs: 1.4 10*3/uL (ref 0.7–4.0)
MCHC: 34.3 g/dL (ref 30.0–36.0)
MCV: 105.8 fl — ABNORMAL HIGH (ref 78.0–100.0)
Monocytes Absolute: 1.4 10*3/uL — ABNORMAL HIGH (ref 0.1–1.0)
Monocytes Relative: 12.1 % — ABNORMAL HIGH (ref 3.0–12.0)
Neutro Abs: 8.9 10*3/uL — ABNORMAL HIGH (ref 1.4–7.7)
Neutrophils Relative %: 75.3 % (ref 43.0–77.0)
Platelets: 168 10*3/uL (ref 150.0–400.0)
RBC: 3.22 Mil/uL — ABNORMAL LOW (ref 3.87–5.11)
RDW: 16.7 % — ABNORMAL HIGH (ref 11.5–15.5)
WBC: 11.8 10*3/uL — ABNORMAL HIGH (ref 4.0–10.5)

## 2021-06-14 LAB — LIPASE: Lipase: 138 U/L — ABNORMAL HIGH (ref 11.0–59.0)

## 2021-06-14 LAB — HEPATIC FUNCTION PANEL
ALT: 53 U/L — ABNORMAL HIGH (ref 0–35)
AST: 209 U/L — ABNORMAL HIGH (ref 0–37)
Albumin: 3.6 g/dL (ref 3.5–5.2)
Alkaline Phosphatase: 238 U/L — ABNORMAL HIGH (ref 39–117)
Bilirubin, Direct: 7.7 mg/dL — ABNORMAL HIGH (ref 0.0–0.3)
Total Bilirubin: 13.8 mg/dL — ABNORMAL HIGH (ref 0.2–1.2)
Total Protein: 8 g/dL (ref 6.0–8.3)

## 2021-06-14 LAB — URINALYSIS, ROUTINE W REFLEX MICROSCOPIC: RBC / HPF: NONE SEEN (ref 0–?)

## 2021-06-14 LAB — IRON: Iron: 115 ug/dL (ref 42–145)

## 2021-06-14 LAB — AMYLASE: Amylase: 32 U/L (ref 27–131)

## 2021-06-14 LAB — BRAIN NATRIURETIC PEPTIDE: Pro B Natriuretic peptide (BNP): 41 pg/mL (ref 0.0–100.0)

## 2021-06-14 NOTE — Progress Notes (Signed)
Subjective:    Patient ID: Kristine Sims, female    DOB: [birth year], 50 y.o.   MRN: [id number]  HPI Here for several issues. The main reason she came is that she has been jaundiced for the past week. This has never happened before. No abdominal pain, no NVD. She has not been aware of any fever, although we recorded a low grade fever here today. She notes that for the past 4 weeks she has rather quickly developed a swollen abdomen and swollen legs. She has also been more SOB than usual. No chest pain or coughing. She has been seeing Dr. Allena Katz for a polyneuropathy that manifests primarily as numbness and tingling. This has been attributed to heavy alcohol use because for a 3 year period she was drinking 5-6 mixed alcoholic drinks every day.in April of this year she was able to stop drinking completely, but she had withdrawal effects and felt terrible, so she started drinking again in June. Since then she has averaged 2 mixed drinks a day. She says the neuropathy has actually improved with less alcohol consumption. As for the jaundice this week, she denies any recent travel. She denies using any illicit drugs. She is currently not taking any medications.    Review of Systems  Constitutional:  Positive for fatigue.  Respiratory:  Positive for shortness of breath. Negative for cough and wheezing.   Cardiovascular:  Positive for leg swelling. Negative for chest pain and palpitations.  Gastrointestinal:  Positive for abdominal distention. Negative for abdominal pain, blood in stool, constipation, diarrhea, nausea and vomiting.  Endocrine: Negative.   Genitourinary: Negative.   Musculoskeletal: Negative.   Skin: Negative.   Neurological:  Positive for numbness.      Objective:   Physical Exam Constitutional:      Comments: Very weak and shaky, walks with a cane   Eyes:     General: Scleral icterus present.     Pupils: Pupils are equal, round, and reactive to light.  Cardiovascular:     Rate and  Rhythm: Normal rate and regular rhythm.     Pulses: Normal pulses.     Heart sounds: Normal heart sounds.  Pulmonary:     Effort: Pulmonary effort is normal.     Breath sounds: Normal breath sounds. No rales.  Abdominal:     General: Bowel sounds are normal. There is distension.     Palpations: There is no mass.     Tenderness: There is no abdominal tenderness. There is no guarding or rebound.     Hernia: No hernia is present.  Musculoskeletal:     Comments: 3+ edema in both legs   Lymphadenopathy:     Cervical: No cervical adenopathy.  Skin:    Coloration: Skin is jaundiced.     Findings: No bruising or rash.     Comments: She is jaundiced in the sclera and all over the body   Neurological:     Mental Status: She is alert and oriented to person, place, and time.          Assessment & Plan:  She has a background of neuropathy from excessive alcohol consumption and now appears to have ascites, which is likely the result of hepatic cirrhosis. No evidence of CHF. She also has the acute onset of jaundice, which could result of obstruction of the biliary tree or infection. We will set up a contrasted CT of the abdomen and pelvis which I hope will be done tomorrow. We will also  get labs to check a hepatic panel, renal function, CBC, lipase, amylase, and for various causes of hepatitis. We will follow up with her tomorrow about these results, then determine what the next steps should be. We spent a total of ( 35  ) minutes reviewing records and discussing these issues.  Gershon Crane, MD

## 2021-06-15 ENCOUNTER — Ambulatory Visit
Admission: RE | Admit: 2021-06-15 | Discharge: 2021-06-15 | Disposition: A | Discharge status: Home / Self Care | Payer: BC Managed Care – PPO | Source: Ambulatory Visit | Attending: Family Medicine | Admitting: Family Medicine

## 2021-06-15 ENCOUNTER — Encounter: Payer: Self-pay | Admitting: Gastroenterology

## 2021-06-15 ENCOUNTER — Telehealth: Payer: Self-pay | Admitting: Family Medicine

## 2021-06-15 DIAGNOSIS — S22080A Wedge compression fracture of T11-T12 vertebra, initial encounter for closed fracture: Secondary | ICD-10-CM | POA: Diagnosis not present

## 2021-06-15 DIAGNOSIS — K7689 Other specified diseases of liver: Secondary | ICD-10-CM | POA: Diagnosis not present

## 2021-06-15 DIAGNOSIS — R17 Unspecified jaundice: Secondary | ICD-10-CM | POA: Diagnosis not present

## 2021-06-15 DIAGNOSIS — D259 Leiomyoma of uterus, unspecified: Secondary | ICD-10-CM | POA: Diagnosis not present

## 2021-06-15 DIAGNOSIS — R188 Other ascites: Secondary | ICD-10-CM

## 2021-06-15 MED ORDER — IOPAMIDOL (ISOVUE-300) INJECTION 61%
100.0000 mL | Freq: Once | INTRAVENOUS | Status: AC | PRN
  Administered 2021-06-15: 100 mL via INTRAVENOUS

## 2021-06-15 NOTE — Telephone Encounter (Signed)
Patient is requesting a note from Dr.Fry for jury duty.  Patient could be contacted at [phone].  Please advise.

## 2021-06-15 NOTE — Addendum Note (Signed)
Addended by: Gershon Crane A on: 06/15/2021 01:08 PM   Modules accepted: Orders

## 2021-06-15 NOTE — Telephone Encounter (Signed)
Last office visit 06/14/21 ?

## 2021-06-16 ENCOUNTER — Telehealth: Payer: Self-pay | Admitting: Family Medicine

## 2021-06-16 ENCOUNTER — Other Ambulatory Visit: Payer: Self-pay

## 2021-06-16 LAB — HEPATITIS PANEL, ACUTE
Hep A IgM: NONREACTIVE
Hep B C IgM: NONREACTIVE
Hepatitis B Surface Ag: NONREACTIVE
Hepatitis C Ab: NONREACTIVE
SIGNAL TO CUT-OFF: 0.02 (ref <1.00)

## 2021-06-16 LAB — HIV ANTIBODY (ROUTINE TESTING W REFLEX): HIV 1&2 Ab, 4th Generation: NONREACTIVE

## 2021-06-16 LAB — CYTOMEGALOVIRUS ANTIBODY, IGG: Cytomegalovirus Ab-IgG: >10 U/mL — ABNORMAL HIGH

## 2021-06-16 LAB — EPSTEIN-BARR VIRUS VCA, IGG: EBV VCA IgG: <18 U/mL

## 2021-06-16 LAB — EPSTEIN-BARR VIRUS VCA, IGM: EBV VCA IgM: <36 U/mL

## 2021-06-16 MED ORDER — FUROSEMIDE 40 MG PO TABS: 40.0000 mg | ORAL_TABLET | Freq: Every day | ORAL | 2 refills | Status: AC

## 2021-06-16 NOTE — Telephone Encounter (Signed)
Patient called to discuss being put on lasix. Lasix was brought up at previous appointment and patient feels it is time to go on it. Patient was seen earlier on 10/05, and does not feel the need for an appointment. Appointment was denied when offered, patient wants phone call     Good callback number is [phone]

## 2021-06-16 NOTE — Telephone Encounter (Signed)
Called pt advised that her Jury Duty letter is ready for pick up, pt state that she downloaded one from MyChart portal, states that she will pick up the original from the office

## 2021-06-16 NOTE — Telephone Encounter (Signed)
The letter is ready  

## 2021-06-16 NOTE — Telephone Encounter (Signed)
Patient was last seen on 06/14/21.  Please advise

## 2021-06-16 NOTE — Telephone Encounter (Signed)
I agree. Call in Lasix 40 mg daily, #30 with 2 rf

## 2021-06-16 NOTE — Telephone Encounter (Signed)
Rx sent to pt pharmacy, left a detailed message on pt phone

## 2021-07-04 ENCOUNTER — Emergency Department (HOSPITAL_COMMUNITY): Payer: BC Managed Care – PPO

## 2021-07-04 ENCOUNTER — Encounter (HOSPITAL_COMMUNITY): Payer: Self-pay | Admitting: Emergency Medicine

## 2021-07-04 ENCOUNTER — Inpatient Hospital Stay (HOSPITAL_COMMUNITY)
Admission: EM | Admit: 2021-07-04 | Discharge: 2021-07-11 | DRG: 432 | Disposition: E | Payer: BC Managed Care – PPO | Attending: Student | Admitting: Student

## 2021-07-04 ENCOUNTER — Ambulatory Visit: Payer: BC Managed Care – PPO | Admitting: Gastroenterology

## 2021-07-04 ENCOUNTER — Inpatient Hospital Stay (HOSPITAL_COMMUNITY): Payer: BC Managed Care – PPO

## 2021-07-04 DIAGNOSIS — K589 Irritable bowel syndrome without diarrhea: Secondary | ICD-10-CM | POA: Diagnosis present

## 2021-07-04 DIAGNOSIS — E871 Hypo-osmolality and hyponatremia: Secondary | ICD-10-CM | POA: Diagnosis present

## 2021-07-04 DIAGNOSIS — R7989 Other specified abnormal findings of blood chemistry: Secondary | ICD-10-CM | POA: Diagnosis present

## 2021-07-04 DIAGNOSIS — R5381 Other malaise: Secondary | ICD-10-CM | POA: Diagnosis present

## 2021-07-04 DIAGNOSIS — Z66 Do not resuscitate: Secondary | ICD-10-CM | POA: Diagnosis not present

## 2021-07-04 DIAGNOSIS — G621 Alcoholic polyneuropathy: Secondary | ICD-10-CM | POA: Diagnosis present

## 2021-07-04 DIAGNOSIS — E8770 Fluid overload, unspecified: Secondary | ICD-10-CM | POA: Diagnosis present

## 2021-07-04 DIAGNOSIS — I8510 Secondary esophageal varices without bleeding: Secondary | ICD-10-CM | POA: Diagnosis present

## 2021-07-04 DIAGNOSIS — R748 Abnormal levels of other serum enzymes: Secondary | ICD-10-CM | POA: Diagnosis not present

## 2021-07-04 DIAGNOSIS — F10239 Alcohol dependence with withdrawal, unspecified: Secondary | ICD-10-CM | POA: Diagnosis present

## 2021-07-04 DIAGNOSIS — E869 Volume depletion, unspecified: Secondary | ICD-10-CM | POA: Diagnosis present

## 2021-07-04 DIAGNOSIS — Z818 Family history of other mental and behavioral disorders: Secondary | ICD-10-CM

## 2021-07-04 DIAGNOSIS — G43909 Migraine, unspecified, not intractable, without status migrainosus: Secondary | ICD-10-CM | POA: Diagnosis present

## 2021-07-04 DIAGNOSIS — K7682 Hepatic encephalopathy: Secondary | ICD-10-CM | POA: Diagnosis not present

## 2021-07-04 DIAGNOSIS — K7031 Alcoholic cirrhosis of liver with ascites: Secondary | ICD-10-CM | POA: Diagnosis present

## 2021-07-04 DIAGNOSIS — K7011 Alcoholic hepatitis with ascites: Secondary | ICD-10-CM

## 2021-07-04 DIAGNOSIS — Z20822 Contact with and (suspected) exposure to covid-19: Secondary | ICD-10-CM | POA: Diagnosis present

## 2021-07-04 DIAGNOSIS — Z6841 Body Mass Index (BMI) 40.0 and over, adult: Secondary | ICD-10-CM

## 2021-07-04 DIAGNOSIS — Z79899 Other long term (current) drug therapy: Secondary | ICD-10-CM

## 2021-07-04 DIAGNOSIS — Z885 Allergy status to narcotic agent status: Secondary | ICD-10-CM

## 2021-07-04 DIAGNOSIS — K766 Portal hypertension: Secondary | ICD-10-CM | POA: Diagnosis present

## 2021-07-04 DIAGNOSIS — G9341 Metabolic encephalopathy: Secondary | ICD-10-CM | POA: Diagnosis present

## 2021-07-04 DIAGNOSIS — R0603 Acute respiratory distress: Secondary | ICD-10-CM | POA: Diagnosis present

## 2021-07-04 DIAGNOSIS — Z515 Encounter for palliative care: Secondary | ICD-10-CM

## 2021-07-04 DIAGNOSIS — R0681 Apnea, not elsewhere classified: Secondary | ICD-10-CM | POA: Diagnosis present

## 2021-07-04 DIAGNOSIS — K760 Fatty (change of) liver, not elsewhere classified: Secondary | ICD-10-CM | POA: Diagnosis present

## 2021-07-04 DIAGNOSIS — D539 Nutritional anemia, unspecified: Secondary | ICD-10-CM | POA: Diagnosis present

## 2021-07-04 DIAGNOSIS — Y92009 Unspecified place in unspecified non-institutional (private) residence as the place of occurrence of the external cause: Secondary | ICD-10-CM | POA: Diagnosis not present

## 2021-07-04 DIAGNOSIS — Z8249 Family history of ischemic heart disease and other diseases of the circulatory system: Secondary | ICD-10-CM

## 2021-07-04 DIAGNOSIS — R17 Unspecified jaundice: Secondary | ICD-10-CM | POA: Diagnosis not present

## 2021-07-04 DIAGNOSIS — E878 Other disorders of electrolyte and fluid balance, not elsewhere classified: Secondary | ICD-10-CM | POA: Diagnosis present

## 2021-07-04 DIAGNOSIS — R34 Anuria and oliguria: Secondary | ICD-10-CM | POA: Diagnosis not present

## 2021-07-04 DIAGNOSIS — K7200 Acute and subacute hepatic failure without coma: Secondary | ICD-10-CM | POA: Diagnosis present

## 2021-07-04 DIAGNOSIS — F32A Depression, unspecified: Secondary | ICD-10-CM | POA: Diagnosis present

## 2021-07-04 DIAGNOSIS — K30 Functional dyspepsia: Secondary | ICD-10-CM | POA: Diagnosis present

## 2021-07-04 DIAGNOSIS — K767 Hepatorenal syndrome: Secondary | ICD-10-CM | POA: Diagnosis present

## 2021-07-04 DIAGNOSIS — E872 Acidosis, unspecified: Secondary | ICD-10-CM | POA: Diagnosis present

## 2021-07-04 DIAGNOSIS — K7040 Alcoholic hepatic failure without coma: Principal | ICD-10-CM | POA: Diagnosis present

## 2021-07-04 DIAGNOSIS — W19XXXA Unspecified fall, initial encounter: Secondary | ICD-10-CM | POA: Diagnosis present

## 2021-07-04 DIAGNOSIS — N179 Acute kidney failure, unspecified: Secondary | ICD-10-CM | POA: Diagnosis present

## 2021-07-04 DIAGNOSIS — D689 Coagulation defect, unspecified: Secondary | ICD-10-CM | POA: Diagnosis present

## 2021-07-04 DIAGNOSIS — M4854XA Collapsed vertebra, not elsewhere classified, thoracic region, initial encounter for fracture: Secondary | ICD-10-CM | POA: Diagnosis present

## 2021-07-04 DIAGNOSIS — Z880 Allergy status to penicillin: Secondary | ICD-10-CM

## 2021-07-04 DIAGNOSIS — R188 Other ascites: Secondary | ICD-10-CM | POA: Diagnosis present

## 2021-07-04 DIAGNOSIS — K7460 Unspecified cirrhosis of liver: Secondary | ICD-10-CM

## 2021-07-04 DIAGNOSIS — F419 Anxiety disorder, unspecified: Secondary | ICD-10-CM | POA: Diagnosis present

## 2021-07-04 DIAGNOSIS — F1010 Alcohol abuse, uncomplicated: Secondary | ICD-10-CM | POA: Diagnosis not present

## 2021-07-04 DIAGNOSIS — K2970 Gastritis, unspecified, without bleeding: Secondary | ICD-10-CM | POA: Diagnosis present

## 2021-07-04 DIAGNOSIS — K209 Esophagitis, unspecified without bleeding: Secondary | ICD-10-CM | POA: Diagnosis present

## 2021-07-04 DIAGNOSIS — R0602 Shortness of breath: Secondary | ICD-10-CM | POA: Diagnosis not present

## 2021-07-04 HISTORY — PX: IR PARACENTESIS: IMG2679

## 2021-07-04 LAB — OSMOLALITY: Osmolality: 283 mOsm/kg (ref 275–295)

## 2021-07-04 LAB — BASIC METABOLIC PANEL
Anion gap: 24 — ABNORMAL HIGH (ref 5–15)
BUN: 33 mg/dL — ABNORMAL HIGH (ref 6–20)
CO2: 19 mmol/L — ABNORMAL LOW (ref 22–32)
Calcium: 8.3 mg/dL — ABNORMAL LOW (ref 8.9–10.3)
Chloride: 74 mmol/L — ABNORMAL LOW (ref 98–111)
Creatinine, Ser: 1.64 mg/dL — ABNORMAL HIGH (ref 0.44–1.00)
GFR, Estimated: 37 mL/min — ABNORMAL LOW (ref >60)
Glucose, Bld: 74 mg/dL (ref 70–99)
Potassium: 4.2 mmol/L (ref 3.5–5.1)
Sodium: 117 mmol/L — CL (ref 135–145)

## 2021-07-04 LAB — RESP PANEL BY RT-PCR (FLU A&B, COVID) ARPGX2
Influenza A by PCR: NEGATIVE
Influenza B by PCR: NEGATIVE
SARS Coronavirus 2 by RT PCR: NEGATIVE

## 2021-07-04 LAB — I-STAT BETA HCG BLOOD, ED (MC, WL, AP ONLY): I-stat hCG, quantitative: 5.5 m[IU]/mL — ABNORMAL HIGH (ref <5)

## 2021-07-04 LAB — TSH: TSH: 3.263 u[IU]/mL (ref 0.350–4.500)

## 2021-07-04 LAB — CBC WITH DIFFERENTIAL/PLATELET
Abs Immature Granulocytes: 0.48 10*3/uL — ABNORMAL HIGH (ref 0.00–0.07)
Basophils Absolute: 0 10*3/uL (ref 0.0–0.1)
Basophils Relative: 0 %
Eosinophils Absolute: 0 10*3/uL (ref 0.0–0.5)
Eosinophils Relative: 0 %
HCT: 23.3 % — ABNORMAL LOW (ref 36.0–46.0)
Hemoglobin: 8.3 g/dL — ABNORMAL LOW (ref 12.0–15.0)
Immature Granulocytes: 3 %
Lymphocytes Relative: 7 %
Lymphs Abs: 1.1 10*3/uL (ref 0.7–4.0)
MCH: 38.1 pg — ABNORMAL HIGH (ref 26.0–34.0)
MCHC: 35.6 g/dL (ref 30.0–36.0)
MCV: 106.9 fL — ABNORMAL HIGH (ref 80.0–100.0)
Monocytes Absolute: 1.5 10*3/uL — ABNORMAL HIGH (ref 0.1–1.0)
Monocytes Relative: 9 %
Neutro Abs: 12.5 10*3/uL — ABNORMAL HIGH (ref 1.7–7.7)
Neutrophils Relative %: 81 %
Platelets: 172 10*3/uL (ref 150–400)
RBC: 2.18 MIL/uL — ABNORMAL LOW (ref 3.87–5.11)
RDW: 17.6 % — ABNORMAL HIGH (ref 11.5–15.5)
WBC: 15.6 10*3/uL — ABNORMAL HIGH (ref 4.0–10.5)
nRBC: 0.1 % (ref 0.0–0.2)

## 2021-07-04 LAB — COMPREHENSIVE METABOLIC PANEL
ALT: 59 U/L — ABNORMAL HIGH (ref 0–44)
AST: 232 U/L — ABNORMAL HIGH (ref 15–41)
Albumin: 2.4 g/dL — ABNORMAL LOW (ref 3.5–5.0)
Alkaline Phosphatase: 157 U/L — ABNORMAL HIGH (ref 38–126)
Anion gap: 21 — ABNORMAL HIGH (ref 5–15)
BUN: 31 mg/dL — ABNORMAL HIGH (ref 6–20)
CO2: 21 mmol/L — ABNORMAL LOW (ref 22–32)
Calcium: 8.4 mg/dL — ABNORMAL LOW (ref 8.9–10.3)
Chloride: 74 mmol/L — ABNORMAL LOW (ref 98–111)
Creatinine, Ser: 1.68 mg/dL — ABNORMAL HIGH (ref 0.44–1.00)
GFR, Estimated: 36 mL/min — ABNORMAL LOW (ref >60)
Glucose, Bld: 83 mg/dL (ref 70–99)
Potassium: 4.4 mmol/L (ref 3.5–5.1)
Sodium: 116 mmol/L — CL (ref 135–145)
Total Bilirubin: 21.7 mg/dL (ref 0.3–1.2)
Total Protein: 7.7 g/dL (ref 6.5–8.1)

## 2021-07-04 LAB — PROTIME-INR
INR: 1.3 — ABNORMAL HIGH (ref 0.8–1.2)
Prothrombin Time: 16.3 seconds — ABNORMAL HIGH (ref 11.4–15.2)

## 2021-07-04 LAB — LIPASE, BLOOD: Lipase: 407 U/L — ABNORMAL HIGH (ref 11–51)

## 2021-07-04 LAB — GRAM STAIN

## 2021-07-04 LAB — ALBUMIN, PLEURAL OR PERITONEAL FLUID: Albumin, Fluid: <1.5 g/dL

## 2021-07-04 LAB — VITAMIN B12: Vitamin B-12: 752 pg/mL (ref 180–914)

## 2021-07-04 LAB — PROTEIN, PLEURAL OR PERITONEAL FLUID: Total protein, fluid: <3 g/dL

## 2021-07-04 LAB — BODY FLUID CELL COUNT WITH DIFFERENTIAL
Eos, Fluid: 0 %
Lymphs, Fluid: 81 %
Monocyte-Macrophage-Serous Fluid: 8 % — ABNORMAL LOW (ref 50–90)
Neutrophil Count, Fluid: 11 % (ref 0–25)
Total Nucleated Cell Count, Fluid: 402 cu mm (ref 0–1000)

## 2021-07-04 LAB — GLUCOSE, PLEURAL OR PERITONEAL FLUID: Glucose, Fluid: 90 mg/dL

## 2021-07-04 LAB — CORTISOL: Cortisol, Plasma: 44.7 ug/dL

## 2021-07-04 LAB — FOLATE: Folate: 4.4 ng/mL — ABNORMAL LOW (ref >5.9)

## 2021-07-04 LAB — AMMONIA: Ammonia: 75 umol/L — ABNORMAL HIGH (ref 9–35)

## 2021-07-04 LAB — HEMOGLOBIN AND HEMATOCRIT, BLOOD
HCT: 21.9 % — ABNORMAL LOW (ref 36.0–46.0)
Hemoglobin: 7.8 g/dL — ABNORMAL LOW (ref 12.0–15.0)

## 2021-07-04 LAB — BRAIN NATRIURETIC PEPTIDE: B Natriuretic Peptide: 149.5 pg/mL — ABNORMAL HIGH (ref 0.0–100.0)

## 2021-07-04 MED ORDER — ONDANSETRON HCL 4 MG/2ML IJ SOLN
4.0000 mg | Freq: Four times a day (QID) | INTRAMUSCULAR | Status: DC | PRN
  Administered 2021-07-04: 4 mg via INTRAVENOUS
  Filled 2021-07-04: qty 2

## 2021-07-04 MED ORDER — ADULT MULTIVITAMIN W/MINERALS CH
1.0000 | ORAL_TABLET | Freq: Every day | ORAL | Status: DC
  Filled 2021-07-04 (×2): qty 1

## 2021-07-04 MED ORDER — ONDANSETRON HCL 4 MG PO TABS: 4.0000 mg | ORAL_TABLET | Freq: Four times a day (QID) | ORAL | Status: DC | PRN

## 2021-07-04 MED ORDER — ALUM & MAG HYDROXIDE-SIMETH 200-200-20 MG/5ML PO SUSP
15.0000 mL | Freq: Four times a day (QID) | ORAL | Status: DC | PRN
  Administered 2021-07-06 (×2): 15 mL via ORAL
  Filled 2021-07-04 (×2): qty 30

## 2021-07-04 MED ORDER — LORAZEPAM 1 MG PO TABS: 1.0000 mg | ORAL_TABLET | ORAL | Status: DC | PRN

## 2021-07-04 MED ORDER — SODIUM CHLORIDE 0.9 % IV BOLUS: 1000.0000 mL | Freq: Once | INTRAVENOUS | Status: DC

## 2021-07-04 MED ORDER — FAMOTIDINE 20 MG PO TABS
10.0000 mg | ORAL_TABLET | Freq: Two times a day (BID) | ORAL | Status: DC | PRN
  Filled 2021-07-04: qty 1

## 2021-07-04 MED ORDER — ALBUTEROL SULFATE (2.5 MG/3ML) 0.083% IN NEBU: 2.5000 mg | INHALATION_SOLUTION | RESPIRATORY_TRACT | Status: DC | PRN

## 2021-07-04 MED ORDER — PREDNISOLONE 5 MG PO TABS
40.0000 mg | ORAL_TABLET | Freq: Every day | ORAL | Status: DC
  Administered 2021-07-05 – 2021-07-06 (×2): 40 mg via ORAL
  Filled 2021-07-04 (×3): qty 8

## 2021-07-04 MED ORDER — PANTOPRAZOLE SODIUM 40 MG IV SOLR
40.0000 mg | Freq: Two times a day (BID) | INTRAVENOUS | Status: DC
  Administered 2021-07-04 – 2021-07-06 (×4): 40 mg via INTRAVENOUS
  Filled 2021-07-04 (×4): qty 40

## 2021-07-04 MED ORDER — FOLIC ACID 1 MG PO TABS
1.0000 mg | ORAL_TABLET | Freq: Every day | ORAL | Status: DC
  Administered 2021-07-06: 1 mg via ORAL
  Filled 2021-07-04 (×3): qty 1

## 2021-07-04 MED ORDER — THIAMINE HCL 100 MG PO TABS
100.0000 mg | ORAL_TABLET | Freq: Every day | ORAL | Status: DC
  Administered 2021-07-06: 100 mg via ORAL
  Filled 2021-07-04: qty 1

## 2021-07-04 MED ORDER — LORAZEPAM 2 MG/ML IJ SOLN
1.0000 mg | INTRAMUSCULAR | Status: DC | PRN
  Administered 2021-07-04 – 2021-07-06 (×4): 1 mg via INTRAVENOUS
  Filled 2021-07-04 (×4): qty 1

## 2021-07-04 MED ORDER — FUROSEMIDE 10 MG/ML IJ SOLN
80.0000 mg | Freq: Two times a day (BID) | INTRAMUSCULAR | Status: DC
  Administered 2021-07-04 – 2021-07-06 (×4): 80 mg via INTRAVENOUS
  Filled 2021-07-04 (×4): qty 8

## 2021-07-04 MED ORDER — THIAMINE HCL 100 MG/ML IJ SOLN
100.0000 mg | Freq: Every day | INTRAMUSCULAR | Status: DC
  Administered 2021-07-04 – 2021-07-05 (×2): 100 mg via INTRAVENOUS
  Filled 2021-07-04 (×2): qty 2

## 2021-07-04 NOTE — Procedures (Signed)
PROCEDURE SUMMARY:  Successful US guided paracentesis from left abdomen.  Yielded 800 ml of orange/yellow fluid.  No immediate complications.  Pt tolerated well.   Specimen sent for labs.  EBL < 2 mL  Mickie Kay, NP 06/25/2021 4:00 PM

## 2021-07-04 NOTE — ED Triage Notes (Signed)
Pt arrives via EMS from home with SOB since Friday with fluid overload. Pt drinks daily, last drink this morning. Pt had CT scan on 10/6 showing liver disease. Pt has Gi appt today but too uncomfortable to go there.

## 2021-07-04 NOTE — Consult Note (Addendum)
Consultation  Referring Provider:   Dr. Langston Masker Primary Care Physician:  Laurey Morale, MD Primary Gastroenterologist: Althia Forts         Reason for Consultation: Acute alcoholic hepatitis            HPI:   Kristine Sims is a 50 y.o. female with a past medical history of chronic alcohol use who presented to the ER today with abdominal swelling, jaundice, fatigue and lethargy over the past several weeks.  We are consulted in regards to likely acute alcoholic hepatitis.    At time of presentation patient's husband assisted with history.  Described that she was a longtime, heavy user of alcohol drinking perhaps a "half gallon of liquor" daily.  Did describe that she went through withdrawal symptoms when she stopped drinking, but he denied that she had ever had a seizure.  He reported that he noted she had appeared more yellow over the past 3 to 4 weeks and that she was not eating much food generally just drinking lemonade mixed with vodka.  Also increased shortness of breath over the past 2 days with a need to sleep upright because she could not lay flat.  Increased abdominal distention and swelling in her legs.    Today, patient is seen with her husband by her bedside who does assist with history.  Apparently she has had a chronic history of alcohol abuse even at one point leading to malfunction of her legs for which she saw neurology and was told to discontinue alcohol use about a year ago, apparently she did decrease at that point to a "maintenance" level of about a half a gallon of vodka per week.  She did regain function of her legs, but about a month ago patient had a fall and after that everything seemed to worsen.  Her husband noticed that she was becoming jaundiced and she gradually grew more fatigued and short of breath when trying to exert herself barely being able to make it from the couch to the bathroom describing episodes of accidents urinary and fecal.  Also with increasing reflux  symptoms and just general malaise.  Does describe a generalized tightness over the entirety of her abdomen and feeling "full", she has been unable to eat anything over the past 3 days but has been trying to drink some liquids.  Does describe some episodes of nausea and vomiting.    Denies fever or chills.  ED course: Sodium 116, AST 232, ALT 59, alk phos 157, total bili 21.7, creatinine 1.68, white count 15.6, hemoglobin 8.3, lipase 407, INR 1.3; right upper quadrant ultrasound with nodular echogenic liver parenchyma compatible with cirrhosis, no focal abnormality evident, although limited due to poor acoustic transmission, no biliary dilation; paracentesis with removal of 800 mL of orange-yellow fluid  GI History: none  Past Medical History:  Diagnosis Date  . Acute recurrent maxillary sinusitis    frequent, sees Dr. Radene Journey  . Allergy   . Anxiety   . Asthma attack 09/07/2011  . Depression   . IBS (irritable bowel syndrome)   . Migraines   . Peripheral neuropathy    per Dr. Timmothy Sours and Dr. Hardin Negus    Past Surgical History:  Procedure Laterality Date  . CESAREAN SECTION    . LUMBAR DISC SURGERY    . NASAL SEPTUM SURGERY      Family History  Problem Relation Age of Onset  . Hypertension Other   . Heart disease Other   .  Cancer Mother        lung cancer  . Hypertension Father   . Heart disease Father   . Hyperlipidemia Brother   . Hypertension Brother   . Mental illness Sister        depression  . Hypertension Brother   . Asperger's syndrome Son   . ADD / ADHD Son     Social History   Tobacco Use  . Smoking status: Never  . Smokeless tobacco: Never  Vaping Use  . Vaping Use: Never used  Substance Use Topics  . Alcohol use: Not Currently    Alcohol/week: 14.0 standard drinks    Types: 14 Standard drinks or equivalent per week    Comment: increased from occasionally to cope with anxiety, 2015  . Drug use: No    Prior to Admission medications    Medication Sig Start Date End Date Taking? Authorizing Provider  furosemide (LASIX) 40 MG tablet Take 1 tablet (40 mg total) by mouth daily. 06/16/21   Laurey Morale, MD  Multiple Vitamins-Minerals (MULTIVITAMIN WITH MINERALS) tablet Take 1 tablet by mouth daily. Patient not taking: Reported on 06/14/2021    [provider]  Multiple Vitamins-Minerals (VITAMIN D3 COMPLETE PO) Take by mouth.    [provider]  Vitamin D, Ergocalciferol, (DRISDOL) 1.25 MG (50000 UNIT) CAPS capsule Take 1 capsule (50,000 Units total) by mouth every 7 (seven) days. Patient not taking: Reported on 06/14/2021 06/09/20   Laurey Morale, MD    Current Facility-Administered Medications  Medication Dose Route Frequency Provider Last Rate Last Admin  . sodium chloride 0.9 % bolus 1,000 mL  1,000 mL Intravenous Once Trifan, Carola Rhine, MD       Current Outpatient Medications  Medication Sig Dispense Refill  . furosemide (LASIX) 40 MG tablet Take 1 tablet (40 mg total) by mouth daily. 30 tablet 2  . Multiple Vitamins-Minerals (MULTIVITAMIN WITH MINERALS) tablet Take 1 tablet by mouth daily. (Patient not taking: Reported on 06/14/2021)    . Multiple Vitamins-Minerals (VITAMIN D3 COMPLETE PO) Take by mouth.    . Vitamin D, Ergocalciferol, (DRISDOL) 1.25 MG (50000 UNIT) CAPS capsule Take 1 capsule (50,000 Units total) by mouth every 7 (seven) days. (Patient not taking: Reported on 06/14/2021) 12 capsule 3    Allergies as of 07/10/2021 - Review Complete 06/24/2021  Allergen Reaction Noted  . Amoxicillin Swelling 09/19/2016  . Augmentin [amoxicillin-pot clavulanate] Swelling 09/19/2016  . Oxycodone-acetaminophen  08/08/2007     Review of Systems:    Constitutional: +weight loss and fatigue Skin: +jaundice Cardiovascular: No chest pain  Respiratory: +SOB Gastrointestinal: See HPI and otherwise negative Genitourinary: No dysuria  Neurological: No headache Musculoskeletal: No new muscle or joint  pain Hematologic: No bleeding  Psychiatric: No history of depression or anxiety    Physical Exam:  Vital signs in last 24 hours: Temp:  [98.2 F (36.8 C)] 98.2 F (36.8 C) (10/25 1222) Pulse Rate:  [91-94] 91 (10/25 1600) Resp:  [18-28] 28 (10/25 1600) BP: (114-120)/(58-60) 114/58 (10/25 1600) SpO2:  [95 %-98 %] 95 % (10/25 1600)   General:   Jaundiced, ill-appearing Caucasian female appears to be in mild distress, Well developed, Well nourished, alert and cooperative + mildly encephalopathic? Head:  Normocephalic and atraumatic. Eyes:   PEERL, EOMI. No icterus. Conjunctiva pink. Ears:  Normal auditory acuity. Neck:  Supple Throat: Oral cavity and pharynx without inflammation, swelling or lesion.  Lungs: Respirations even and unlabored. Lungs clear to auscultation bilaterally.   Heart: Normal  S1, S2. No MRG. Regular rate and rhythm. + Marked bilateral lower remedy edema Abdomen:  Soft, moderate distention nontender. No rebound or guarding. Normal bowel sounds. No appreciable masses or hepatomegaly. Rectal:  Not performed.  Msk:  Symmetrical without gross deformities. Peripheral pulses intact.  Extremities:  No deformity or joint abnormality.  Neurologic:  Alert and  oriented x3;  grossly normal neurologically.  No asterixis  Skin:   Dry and intact without significant lesions or rashes. Psychiatric: Slow cognition, impaired memory, slowed speech  LAB RESULTS: Recent Labs    07/06/2021 1310  WBC 15.6*  HGB 8.3*  HCT 23.3*  PLT 172   BMET Recent Labs    07/07/2021 1310  NA 116*  K 4.4  CL 74*  CO2 21*  GLUCOSE 83  BUN 31*  CREATININE 1.68*  CALCIUM 8.4*   LFT Recent Labs    06/25/2021 1310  PROT 7.7  ALBUMIN 2.4*  AST 232*  ALT 59*  ALKPHOS 157*  BILITOT 21.7*   PT/INR Recent Labs    07/03/2021 1310  LABPROT 16.3*  INR 1.3*    STUDIES: US Abdomen Limited RUQ (LIVER/GB)  Result Date: 07/06/2021 CLINICAL DATA:  Jaundice and cirrhosis. EXAM: ULTRASOUND  ABDOMEN LIMITED RIGHT UPPER QUADRANT COMPARISON:  None. FINDINGS: Gallbladder: No gallstones or wall thickening visualized. No sonographic Murphy sign noted by sonographer. Common bile duct: Diameter: Not well seen.  Approximately 3-4 mm diameter. Liver: Markedly echogenic and heterogeneous liver parenchyma with nodular contour. Portal vein is patent on color Doppler imaging with normal direction of blood flow towards the liver. Other: None. IMPRESSION: Nodular, echogenic liver parenchyma compatible with cirrhosis. No focal abnormality evident although assessment limited due to poor acoustic through transmission. No biliary dilatation Electronically Signed   By: Misty Stanley M.D.   On: 06/14/2021 15:02      Impression / Plan:   Impression: 1.  Acute alcoholic hepatitis: Underlying cirrhosis, worsening ascites over the past few weeks, right upper quadrant ultrasound with patent portal vein, status post paracentesis with 800 mL of orange/yellow fluid removed, Maddrey score is 39, MELD +Na 32 2.  Alcoholic cirrhosis 3.  Question hepatic encephalopathy: Patient does have slowed mentation and has been reports difficulty with word finding at home, ammonia pending 4.  Alcohol abuse: History of withdrawal symptoms at home, currently drinking half a gallon of vodka a week  Plan: 1.  Do not believe repeat CT at this time is necessary 2.  Ordered Prednisolone 59m qd x28 days given acute alcoholic hepatitis and elevated Maddrey score. 3.  Recommend echo for further evaluation of the heart given shortness of breath and history of alcoholism 4.  Ordered a dietitian consult 5.  Ordered B12/folate 6.  Agree with CIWA protocol 7.  Recommend complete alcohol abstinence.  Discussed this with the patient and her husband. 8.  Ordered Pantoprazole 40 mg IV twice daily 9.  Ordered Pepcid 10 mg twice daily as needed given that patient complains of reflux symptoms now  Thank you for your kind consultation, we will  continue to follow.  JLavone NianLemmon  07/02/2021, 4:08 PM

## 2021-07-04 NOTE — ED Notes (Signed)
Pt returned from IR.

## 2021-07-04 NOTE — ED Notes (Signed)
Pt transported to scans.  

## 2021-07-04 NOTE — ED Provider Notes (Signed)
Emergency Medicine Provider Triage Evaluation Note  Kristine Sims , a 50 y.o. female  was evaluated in triage.  Pt complains of shortness of breath, abdominal bloating, and lower extremity swelling.  Patient with history of alcohol abuse and cirrhosis.  He was recently seen by PCP for jaundice.  Has GI appointment scheduled today but patient did not feel she could wait.  Review of Systems  Positive: Shortness of breath, fatigue, abdominal distention and lower extremity edema Negative: Chest pain, palpitations  Physical Exam  BP 120/60 (BP Location: Left Arm)   Pulse 94   Temp 98.2 F (36.8 C) (Oral)   Resp 18   SpO2 98%  Gen:   Awake, visibly uncomfortable Resp:  Normal effort, dyspneic with full sentence MSK:   Moves extremities without difficulty  Other:  Jaundiced with scleral icterus.  Abdomen significantly distended with fluid wave.  Bilateral lower extremity edema, pitting up to the knees.  Ondansetron/G.  Medical Decision Making  Medically screening exam initiated at 12:38 PM.  Appropriate orders placed.  Kristine Sims was informed that the remainder of the evaluation will be completed by another provider, this initial triage assessment does not replace that evaluation, and the importance of remaining in the ED until their evaluation is complete.  This chart was dictated using voice recognition software, Dragon. Despite the best efforts of this provider to proofread and correct errors, errors may still occur which can change documentation meaning.    Kristine Lore, PA-C 07/03/2021 1300    Kristine Norfolk, DO 06/17/2021 1326

## 2021-07-04 NOTE — ED Notes (Signed)
Pt still down in IR.

## 2021-07-04 NOTE — Consult Note (Addendum)
Reason for Consult: Hyponatremia Referring Physician:  Dr. Lanae Boast  Chief Complaint: Shortness of breath  Assessment/Plan: Renal failure - differential includes prerenal azotemia vs ATN vs hepatorenal syndrome vs obstruction. - Will send urine studies; likely will see a FeNA <1% which could be consistent with hepatorenal or prerenal azotemia. She does not appear to be prerenal on my exam and is floridly overloaded as well as dyspneic. - no absolute indication for RRT at this time and will follow closely with you. - Will also check a renal ultrasound for a baseline renal size and to r/o obstruction. - Dose all meds for creatinine clearance < 30 ml/min  - Unless absolutely necessary, no MRIs with gadolinium.   Hyponatremia - hypervolemic hyponatremia; treating as chronic and goal in 1st 24 hr will be Na of 122-124 to decrease risk of cerebral demyelination. May have a component of reset osmostat as well given the tea and toast diet/ potomania; once she's past the acute setting then may benefit from Ure-Na.  - urine/serum osmolality, TSH, cortisol. - Hesitant to give 3%NS given she is clinically overloaded as well as in mild respiratory distress.  - Will treat with Lasix 80 mg IV twice daily with strict I&O's, daily weights and monitor clinical response. - If response is poor and decreasing trend then may need to challenge with 3% vs RRT to help correct the hyponatremia as well as manage volume status. Alcoholic hepatitis with likely cirrhosis - per GI recs. Certainly encephalopathy is present and GI r/o SBP + ammonia levels. Anemia - may be from myelosuppression but checking B12, folate and iron panel. Alcohol abuse - patient and spouse counseled and they understand the gravity of the situation and clinical status.    HPI: Kristine Sims is an 50 y.o. female PMD chronic alcohol abuse, IBS, migraines with a very long history of heavy alcohol use.  Spouse was bedside and helped provide history as  well in addition to the patient who was lucid at the time of my exam. Patient has been drinking half a galloon of liquor daily and has withdrawal symptoms when she stops drinking. She has noticed a yellowish tinge to her skin and went to see her PCP 3-4 weeks ago with urgent GI referral sent. Unfortunately over the past few days the swelling in the legs has gotten worse and she was brought to the ED by the spouse because of worsening shortness of breath. She has had worsening dyspnea on exertion, orthopnea, fatigue, anorexia and sleepiness. She has also had worsening distention of the abdomen. Spouse states the patient has not been eating much solids at all and primarily consuming water and alcohol with the last drink this morning. She has had nausea but no vomiting; she has epigastric pain but no fever or chills. In the ED her Na was 116 with BUN/Cr 31/1.6 with leukocytosis, TB of 21.7, Hb 8.3. Ultrasound showed a nodular liver consistent with cirrhosis; IR paracentesis of 800 ml performed with  no improvement in shortness of breath. Her BUN/Cr was 5/0.43 on 06/24/2020.   ROS Pertinent items are noted in HPI.  Chemistry and CBC: Creat  Date/Time Value Ref Range Status  06/24/2020 03:43 PM 0.43 (L) 0.50 - 1.05 mg/dL Final    Comment:    For patients >33 years of age, the reference limit for Creatinine is approximately 13% higher for people identified as African-American. .    Creatinine, Ser  Date/Time Value Ref Range Status  07/07/2021 01:10 PM 1.68 (H)  0.44 - 1.00 mg/dL Final  85/46/2703 50:09 PM 0.66 0.50 - 1.10 mg/dL Final  38/18/2993 71:69 PM 0.4 0.4 - 1.2 mg/dL Final   Recent Labs  Lab 06/25/2021 1310  NA 116*  K 4.4  CL 74*  CO2 21*  GLUCOSE 83  BUN 31*  CREATININE 1.68*  CALCIUM 8.4*   Recent Labs  Lab 07/02/2021 1310  WBC 15.6*  NEUTROABS 12.5*  HGB 8.3*  HCT 23.3*  MCV 106.9*  PLT 172   Liver Function Tests: Recent Labs  Lab 06/24/2021 1310  AST 232*  ALT 59*   ALKPHOS 157*  BILITOT 21.7*  PROT 7.7  ALBUMIN 2.4*   Recent Labs  Lab 06/15/2021 1310  LIPASE 407*   No results for input(s): AMMONIA in the last 168 hours. Cardiac Enzymes: No results for input(s): CKTOTAL, CKMB, CKMBINDEX, TROPONINI in the last 168 hours. Iron Studies: No results for input(s): IRON, TIBC, TRANSFERRIN, FERRITIN in the last 72 hours. PT/INR: @LABRCNTIP (inr:5)  Xrays/Other Studies: ) Results for orders placed or performed during the hospital encounter of 06/28/2021 (from the past 48 hour(s))  Comprehensive metabolic panel     Status: Abnormal   Collection Time: 07/03/2021  1:10 PM  Result Value Ref Range   Sodium 116 (LL) 135 - 145 mmol/L    Comment: CRITICAL RESULT CALLED TO, READ BACK BY AND VERIFIED WITH: M.BARBER RN 1435 06/27/2021 MCCORMICK K NO VISIBLE HEMOLYSIS REPEATED TO VERIFY    Potassium 4.4 3.5 - 5.1 mmol/L   Chloride 74 (L) 98 - 111 mmol/L   CO2 21 (L) 22 - 32 mmol/L   Glucose, Bld 83 70 - 99 mg/dL    Comment: Glucose reference range applies only to samples taken after fasting for at least 8 hours.   BUN 31 (H) 6 - 20 mg/dL   Creatinine, Ser 07/06/21 (H) 0.44 - 1.00 mg/dL   Calcium 8.4 (L) 8.9 - 10.3 mg/dL   Total Protein 7.7 6.5 - 8.1 g/dL   Albumin 2.4 (L) 3.5 - 5.0 g/dL   AST 6.78 (H) 15 - 41 U/L   ALT 59 (H) 0 - 44 U/L   Alkaline Phosphatase 157 (H) 38 - 126 U/L   Total Bilirubin 21.7 (HH) 0.3 - 1.2 mg/dL    Comment: REPEATED TO VERIFY CRITICAL RESULT CALLED TO, READ BACK BY AND VERIFIED WITH: M.BARBER RN 1435 06/21/2021 MCCORMICK K    GFR, Estimated 36 (L) >60 mL/min    Comment: (NOTE) Calculated using the CKD-EPI Creatinine Equation (2021)    Anion gap 21 (H) 5 - 15    Comment: REPEATED TO VERIFY Performed at [facility], [address], [place], [place]   CBC with Differential     Status: Abnormal   Collection Time: 06/15/2021  1:10 PM  Result Value Ref Range   WBC 15.6 (H) 4.0 - 10.5 K/uL   RBC 2.18 (L) 3.87 - 5.11  MIL/uL   Hemoglobin 8.3 (L) 12.0 - 15.0 g/dL   HCT 07/06/21 (L) 10.2 - 58.5 %   MCV 106.9 (H) 80.0 - 100.0 fL   MCH 38.1 (H) 26.0 - 34.0 pg   MCHC 35.6 30.0 - 36.0 g/dL   RDW 27.7 (H) 82.4 - 23.5 %   Platelets 172 150 - 400 K/uL   nRBC 0.1 0.0 - 0.2 %   Neutrophils Relative % 81 %   Neutro Abs 12.5 (H) 1.7 - 7.7 K/uL   Lymphocytes Relative 7 %   Lymphs Abs 1.1  0.7 - 4.0 K/uL   Monocytes Relative 9 %   Monocytes Absolute 1.5 (H) 0.1 - 1.0 K/uL   Eosinophils Relative 0 %   Eosinophils Absolute 0.0 0.0 - 0.5 K/uL   Basophils Relative 0 %   Basophils Absolute 0.0 0.0 - 0.1 K/uL   Immature Granulocytes 3 %   Abs Immature Granulocytes 0.48 (H) 0.00 - 0.07 K/uL    Comment: Performed at [facility], [address] [place], [place] [address]  Lipase, blood     Status: Abnormal   Collection Time: 2021-07-16  1:10 PM  Result Value Ref Range   Lipase 407 (H) 11 - 51 U/L    Comment: RESULTS CONFIRMED BY MANUAL DILUTION Performed at [facility], [address], [place], [place] [address]   Protime-INR     Status: Abnormal   Collection Time: 2021/07/16  1:10 PM  Result Value Ref Range   Prothrombin Time 16.3 (H) 11.4 - 15.2 seconds   INR 1.3 (H) 0.8 - 1.2    Comment: (NOTE) INR goal varies based on device and disease states. Performed at [facility], [address] [place], [place] [address]   I-Stat Beta hCG blood, ED ([facility], [facility], [facility] only)     Status: Abnormal   Collection Time: 07-16-21  1:28 PM  Result Value Ref Range   I-stat hCG, quantitative 5.5 (H) <5 mIU/mL   Comment 3            Comment:   GEST. AGE      CONC.  (mIU/mL)   <=1 WEEK        5 - 50     2 WEEKS       50 - 500     3 WEEKS       100 - 10,000     4 WEEKS     1,000 - 30,000        FEMALE AND NON-PREGNANT FEMALE:     LESS THAN 5 mIU/mL   Body fluid cell count with differential     Status: Abnormal   Collection Time: 07/16/2021  4:20 PM  Result Value Ref Range   Fluid Type-FCT PERITONEAL     Comment:  ABDOMEN CORRECTED ON 10/25 AT 1626: PREVIOUSLY REPORTED AS ABDOMEN    Color, Fluid YELLOW YELLOW   Appearance, Fluid CLEAR (A) CLEAR   Total Nucleated Cell Count, Fluid 402 0 - 1,000 cu mm   Neutrophil Count, Fluid 11 0 - 25 %   Lymphs, Fluid 81 %   Monocyte-Macrophage-Serous Fluid 8 (L) 50 - 90 %   Eos, Fluid 0 %   Other Cells, Fluid MESOTHELIAL CELLS %    Comment: Performed at [facility], [address], [place], [place] [address]  Albumin, pleural or peritoneal fluid      Status: None   Collection Time: 2021-07-16  4:20 PM  Result Value Ref Range   Albumin, Fluid <1.5 g/dL   Fluid Type-FALB PERITONEAL     Comment: ABDOMEN Performed at [facility], [address], [place], [place] [address] CORRECTED ON 10/25 AT 1626: PREVIOUSLY REPORTED AS ABDOMEN   Protein, pleural or peritoneal fluid     Status: None   Collection Time: 16-Jul-2021  4:20 PM  Result Value Ref Range   Total protein, fluid <3.0 g/dL   Fluid Type-FTP PERITONEAL     Comment: ABDOMEN Performed at [facility], [address] [place], [place]  [address] CORRECTED ON 10/25 AT 1626: PREVIOUSLY REPORTED AS ABDOMEN   Glucose, pleural or peritoneal fluid     Status: None   Collection Time: Jul 06, 2021  4:20 PM  Result Value Ref Range   Glucose, Fluid 90 mg/dL    Comment: (NOTE) No normal range established for this test Results should be evaluated in conjunction with serum values    Fluid Type-FGLU PERITONEAL     Comment: ABDOMEN Performed at [facility], [address] [place], [place] [address] CORRECTED ON 10/25 AT 1626: PREVIOUSLY REPORTED AS ABDOMEN    DG Chest 2 View  Result Date: 2021/07/06 CLINICAL DATA:  Shortness of breath EXAM: CHEST - 2 VIEW COMPARISON:  07/20/2016 FINDINGS: Borderline heart size, likely accentuated by AP semi upright technique and low lung volumes. Mild pulmonary vascular congestion. Small focal opacity at the right lung base. No pleural effusion or  pneumothorax. IMPRESSION: 1. Small focal opacity at the right lung base may reflect atelectasis or pneumonia. Recommend repeat inspiratory PA and lateral radiographs of the chest for further evaluation. 2. Mild pulmonary vascular congestion. Electronically Signed   By: Duanne Guess D.O.   On: July 06, 2021 16:50   US Abdomen Limited RUQ (LIVER/GB)  Result Date: Jul 06, 2021 CLINICAL DATA:  Jaundice and cirrhosis. EXAM: ULTRASOUND ABDOMEN LIMITED RIGHT UPPER QUADRANT COMPARISON:  None. FINDINGS: Gallbladder: No gallstones or wall thickening visualized. No sonographic Murphy sign noted by sonographer. Common bile duct: Diameter: Not well seen.  Approximately 3-4 mm diameter. Liver: Markedly echogenic and heterogeneous liver parenchyma with nodular contour. Portal vein is patent on color Doppler imaging with normal direction of blood flow towards the liver. Other: None. IMPRESSION: Nodular, echogenic liver parenchyma compatible with cirrhosis. No focal abnormality evident although assessment limited due to poor acoustic through transmission. No biliary dilatation Electronically Signed   By: Kennith Center M.D.   On: 07/06/2021 15:02   IR Paracentesis  Result Date: 07-06-21 INDICATION: Patient with a history of alcohol abuse and cirrhosis presents today with ascites. Interventional Radiology asked to perform a diagnostic and therapeutic paracentesis EXAM: ULTRASOUND GUIDED PARACENTESIS MEDICATIONS: 1% lidocaine 15 mL COMPLICATIONS: None immediate. PROCEDURE: Informed written consent was obtained from the patient after a discussion of the risks, benefits and alternatives to treatment. A timeout was performed prior to the initiation of the procedure. Initial ultrasound scanning demonstrates a large amount of ascites within the left lower abdominal quadrant. The left lower abdomen was prepped and draped in the usual sterile fashion. 1% lidocaine was used for local anesthesia. Following this, a 19 gauge, 15-cm,  Yueh catheter was introduced. An ultrasound image was saved for documentation purposes. The paracentesis was performed. The catheter was removed and a dressing was applied. The patient tolerated the procedure well without immediate post procedural complication. FINDINGS: A total of approximately 800 mL of orange yellow fluid was removed. Samples were sent to the laboratory as requested by the clinical team. IMPRESSION: Successful ultrasound-guided paracentesis yielding 800 mL of peritoneal fluid. Read by: Alwyn Ren, NP Electronically Signed   By: Marliss Coots M.D.   On: 07-06-2021 16:19    PMH:   Past Medical History:  Diagnosis Date   Acute recurrent maxillary sinusitis    frequent, sees Dr. Narda Bonds   Allergy    Anxiety    Asthma attack 09/07/2011   Depression    IBS (irritable bowel syndrome)    Migraines    Peripheral neuropathy    per Dr. Dian Situ and Dr. Vear Clock  PSH:   Past Surgical History:  Procedure Laterality Date   CESAREAN SECTION     IR PARACENTESIS  Jul 10, 2021   LUMBAR DISC SURGERY     NASAL SEPTUM SURGERY      Allergies:  Allergies  Allergen Reactions   Amoxicillin Swelling   Augmentin [Amoxicillin-Pot Clavulanate] Swelling   Oxycodone-Acetaminophen     Medications:   Prior to Admission medications   Medication Sig Start Date End Date Taking? Authorizing Provider  furosemide (LASIX) 40 MG tablet Take 1 tablet (40 mg total) by mouth daily. 06/16/21   Nelwyn Salisbury, MD  Multiple Vitamins-Minerals (MULTIVITAMIN WITH MINERALS) tablet Take 1 tablet by mouth daily. Patient not taking: Reported on 06/14/2021    [provider]  Multiple Vitamins-Minerals (VITAMIN D3 COMPLETE PO) Take by mouth.    [provider]  Vitamin D, Ergocalciferol, (DRISDOL) 1.25 MG (50000 UNIT) CAPS capsule Take 1 capsule (50,000 Units total) by mouth every 7 (seven) days. Patient not taking: Reported on 06/14/2021 06/09/20   Nelwyn Salisbury, MD     Discontinued Meds:  There are no discontinued medications.  Social History:  reports that she has never smoked. She has never used smokeless tobacco. She reports that she does not currently use alcohol after a past usage of about 14.0 standard drinks per week. She reports that she does not use drugs.  Family History:   Family History  Problem Relation Age of Onset   Hypertension Other    Heart disease Other    Cancer Mother        lung cancer   Hypertension Father    Heart disease Father    Hyperlipidemia Brother    Hypertension Brother    Mental illness Sister        depression   Hypertension Brother    Asperger's syndrome Son    ADD / ADHD Son     Blood pressure (!) 120/59, pulse 91, temperature 98.2 F (36.8 C), temperature source Oral, resp. rate 20, SpO2 94 %. General appearance: alert, cooperative, appears stated age, and mild distress Head: Normocephalic, without obvious abnormality, atraumatic Eyes: scleral icterus Neck: JVD - 6 cm above sternal notch, no adenopathy, no carotid bruit, supple, symmetrical, trachea midline, and thyroid not enlarged, symmetric, no tenderness/mass/nodules Back: symmetric, no curvature. ROM normal. No CVA tenderness. Resp: clear to auscultation bilaterally and poor air movement Chest wall: no tenderness Cardio: tachy GI: mild distention Extremities: edema 2+ Pulses: 2+ and symmetric Skin: jaundiced       Ethelene Hal, MD 10-Jul-2021, 6:51 PM

## 2021-07-04 NOTE — ED Notes (Signed)
Pt transported to IR 

## 2021-07-04 NOTE — H&P (Signed)
History and Physical    Kristine Sims UYQ:[id number] DOB: 04-Feb-1971 DOA: 06/21/2021  PCP: Laurey Morale, MD   Patient coming from:  Chief Complaint  Patient presents with   Shortness of Breath   Jaundice     HPI: Kristine Sims is a 50 y.o. female with medical history significant for chronic alcohol abuse, anxiety/depression, alcoholic neuropathy, migraine, IBS presents to the ED with complaint of abdominal swelling, jaundice fatigue and lethargy for past several weeks. Patient lethargic unable to provide history husband at the bedside helping with history. As per report patient drinks "a half a gallon of liquor daily" and goes through withdrawal symptoms once he stops drinking but no history of seizure.  Patient has noticed yellowish discoloration and jaundice for past 3-4 weeks went to see PCP had some blood work done and CT abdomen done and sent to GI for urgent referral.  For the past 2 days worsening shortness of breath fatigue, dyspnea on exertion excessive sleepiness unable to lay flat and with distended abdomen and leg swelling so came to the ED for evaluation Patient also has had fall 3 -4 weeks ago, has not been eating for last 3 days and mostly drinking alcohol last drink this morning.  She has been mostly sitting on the couch and intermittently confused for several days.  No report of fever.  Patient complains of nausea, epigastric discomfort.  CT abdomen from 10/6 showed diffuse liver disease with aortomegaly, perihepatic ascites no biliary dilatation, suspicious for acute hepatitis superimposed on hepatic steatosis, probable hepatic cirrhosis, portal venous hypertension with splenorenal shunting and perisplenic collateral venous structure, small left renal mass indeterminate, 50% compression fracture at T12 subacute.  ED Course: VBP 114-12-/58-68, HR 90 rr 18-28, SPO2 95% on RA.  Labs showed severe hyponatremia 116, chloride 74, AKI with creatinine 1.6 BUN 31, bicarb 21 anion gap 21,  alk phos 157, lipase 407, AST 232, ALT 59 total bilirubin 21.7, with leukocytosis 15.6 anemia hemoglobin 8.3 g-dropped from 11.7 g on 10/5.  Patient was  ordered 1 L normal saline- pending iv access, ultrasound abdomen showed nodular echogenic liver parenchyma compatible with cirrhosis.  Chest x-ray small focal opacity in the right lung base may reflect atelectasis or pneumonia.  Underwent successful IR guided paracentesis of 800 mL peritoneal fluid-fluid was sent for analysis.  Patient was also placed on CIWA protocol and GI was consulted and admission requested.  Assessment/Plan  Decompensated liver cirrhosis new onset, likely alcoholic cirrhosis Acute alcoholic hepatitis Ascites Portal venous hypertension Hepatic steatosis GI has been consulted.  Suspecting alcohol-related liver disease with acute liver failure/decompensated liver cirrhosis, PT/INR pending.  GI  started on prednisone 40 mg, follow-up ascitic fluid, hold off diuretics due to AKI and hyponatremia.  No asterixis on exam does appear mildly confused with slurred speech, ammonia level pending.  Continue full supportive measures  Chronic alcohol abuse at high risk for withdrawal: Patient is placed on CIWA Ativan and thiamine folate.  Admit to progressive unit.  Last drink was this morning.  Severe hypochloremic hyponatremia: Last sodium level was normal 141 on 06/24/2020, suspect multifactorial due to poor intake/hypovolemia and liver cirrhosis, alcoholic pancreatitis related.  Check urine osmole and electrolytes, ordered 1 L normal saline, IV team getting IV access with ultrasound-repeating stat BMP check every 4 hour, nephro has been consulted-defer fluid management to nephrology.  Mild confusion/acute metabolic encephalopathy alert awake oriented to place people but not to exact day could be multifactorial in the setting of decompensated liver  disease, alcohol withdrawal, also has hyponatremia.  Patient is placed on CIWA  Ativan.  Possible Pancreatitis/ elevated lipase: Right upper quadrant ultrasound with cirrhotic appearing liver no gallstones.  CT abdomen from 10/6 showed unremarkable pancreas  AKI Anion gap metabolic acidosis: Likely prerenal from volume depletion, patient has poor oral intake past 3 days and has only been drinking alcohol.  Monitor labs. I/o, ivf as needed- defer to nephro, encourage po  Leukocytosis at 15.6K: No respiratory symptoms, chest x-ray fairly stable although small focal opacity in the right lung base atelectasis or pneumonia.  Check procalcitonin .  Check urinalysis, follow-up ascitic fluid to rule out SBP - low threshold for antibiotics.  Macrocytic anemia with acute drop 8.3 g/g 0.7 g on 10/5: Likely from myelosuppression due to alcohol use -watch for any GI bleeding given patient's cirrhosis and portal hypertension.  Check B12 folate and iron panel check serial H&H.  Epigastric discomfort likely alcohol-related gastritis esophagitis start Protonix IV  T12 compression fracture-had fall 3 to 4 weeks ago Physical deconditioning/debility fall at home  Anxiety/depression:monitor  Overall prognosis guarded to poor at risk of further decompensation liver failure multiorgan failure.  But currently hemodynamically stable, low threshold for ICU transfer.  GI and nephrology has been consulted in the ED.  Husband at the bedside updated discuss about plan of care  There is no height or weight on file to calculate BMI.   Severity of Illness: The appropriate patient status for this patient is INPATIENT. Inpatient status is judged to be reasonable and necessary in order to provide the required intensity of service to ensure the patient's safety. The patient's presenting symptoms, physical exam findings, and initial radiographic and laboratory data in the context of their chronic comorbidities is felt to place them at high risk for further clinical deterioration. Furthermore, it is not  anticipated that the patient will be medically stable for discharge from the hospital within 2 midnights of admission.   * I certify that at the point of admission it is my clinical judgment that the patient will require inpatient hospital care spanning beyond 2 midnights from the point of admission due to high intensity of service, high risk for further deterioration and high frequency of surveillance required.*   DVT prophylaxis: SCDs Start: 06/21/2021 1747SCD Code Status:   Code Status: Full Code  Family Communication: Admission, patients condition and plan of care including tests being ordered have been discussed with the patient and her  husband who indicate understanding and agree with the plan and Code Status.  Consults called:  GI Nephrology  Review of Systems: All systems were reviewed and were negative except as mentioned in HPI above. Negative for fever Negative for chest pain Negative for shortness of breath  Past Medical History:  Diagnosis Date   Acute recurrent maxillary sinusitis    frequent, sees Dr. Radene Journey   Allergy    Anxiety    Asthma attack 09/07/2011   Depression    IBS (irritable bowel syndrome)    Migraines    Peripheral neuropathy    per Dr. Timmothy Sours and Dr. Hardin Negus    Past Surgical History:  Procedure Laterality Date   CESAREAN SECTION     IR PARACENTESIS  07/06/2021   LUMBAR [place]     NASAL SEPTUM SURGERY       reports that she has never smoked. She has never used smokeless tobacco. She reports that she does not currently use alcohol after a past usage of about 14.0  standard drinks per week. She reports that she does not use drugs.  Allergies  Allergen Reactions   Amoxicillin Swelling   Augmentin [Amoxicillin-Pot Clavulanate] Swelling   Oxycodone-Acetaminophen     Family History  Problem Relation Age of Onset   Hypertension Other    Heart disease Other    Cancer Mother        lung cancer   Hypertension Father    Heart  disease Father    Hyperlipidemia Brother    Hypertension Brother    Mental illness Sister        depression   Hypertension Brother    Asperger's syndrome Son    ADD / ADHD Son      Prior to Admission medications   Medication Sig Start Date End Date Taking? Authorizing Provider  furosemide (LASIX) 40 MG tablet Take 1 tablet (40 mg total) by mouth daily. 06/16/21   Laurey Morale, MD  Multiple Vitamins-Minerals (MULTIVITAMIN WITH MINERALS) tablet Take 1 tablet by mouth daily. Patient not taking: Reported on 06/14/2021    [provider]  Multiple Vitamins-Minerals (VITAMIN D3 COMPLETE PO) Take by mouth.    [provider]  Vitamin D, Ergocalciferol, (DRISDOL) 1.25 MG (50000 UNIT) CAPS capsule Take 1 capsule (50,000 Units total) by mouth every 7 (seven) days. Patient not taking: Reported on 06/14/2021 06/09/20   Laurey Morale, MD    Physical Exam: Vitals:   06/13/2021 1222 06/28/2021 1600 06/20/2021 1700  BP: 120/60 (!) 114/58 115/68  Pulse: 94 91 95  Resp: 18 (!) 28 (!) 23  Temp: 98.2 F (36.8 C)    TempSrc: Oral    SpO2: 98% 95% 95%   General exam: slowed speech, lethargic,oriented to place people but not to exact day , NAD, weak appearing. HEENT:Oral mucosa moist, Ear/Nose WNL grossly, dentition normal.  Icterus present Respiratory system: bilaterally clear,no wheezing or crackles,no use of accessory muscle Cardiovascular system: S1 & S2 +, No JVD,. Gastrointestinal system: Abdomen soft, distended, nontender, BS+ Nervous System: Lethargic, no asterixis, moving extremities and grossly nonfocal Extremities: Extensive lower leg edema, distal peripheral pulses palpable.  Skin: No rashes,+++ icterus. MSK: Normal muscle bulk,tone, power   Labs on Admission: I have personally reviewed following labs and imaging studies CBC: Recent Labs  Lab 07/05/2021 1310  WBC 15.6*  NEUTROABS 12.5*  HGB 8.3*  HCT 23.3*  MCV 106.9*  PLT 633   Basic Metabolic Panel: Recent Labs   Lab 06/10/2021 1310  NA 116*  K 4.4  CL 74*  CO2 21*  GLUCOSE 83  BUN 31*  CREATININE 1.68*  CALCIUM 8.4*   GFR: CrCl cannot be calculated (Unknown ideal weight.). Liver Function Tests: Recent Labs  Lab 06/10/2021 1310  AST 232*  ALT 59*  ALKPHOS 157*  BILITOT 21.7*  PROT 7.7  ALBUMIN 2.4*   Recent Labs  Lab 07/02/2021 1310  LIPASE 407*   No results for input(s): AMMONIA in the last 168 hours. Coagulation Profile: Recent Labs  Lab 06/27/2021 1310  INR 1.3*   Cardiac Enzymes: No results for input(s): CKTOTAL, CKMB, CKMBINDEX, TROPONINI in the last 168 hours. BNP (last 3 results) Recent Labs    06/14/21 1456  PROBNP 41.0   HbA1C: No results for input(s): HGBA1C in the last 72 hours. CBG: No results for input(s): GLUCAP in the last 168 hours. Lipid Profile: No results for input(s): CHOL, HDL, LDLCALC, TRIG, CHOLHDL, LDLDIRECT in the last 72 hours. Thyroid Function Tests: No results for input(s): TSH, T4TOTAL, FREET4,  T3FREE, THYROIDAB in the last 72 hours. Anemia Panel: No results for input(s): VITAMINB12, FOLATE, FERRITIN, TIBC, IRON, RETICCTPCT in the last 72 hours. Urine analysis:    Component Value Date/Time   COLORURINE Orange (A) 06/14/2021 1456   APPEARANCEUR Sl Cloudy (A) 06/14/2021 1456   LABSPEC Color Interference (A) 06/14/2021 1456   PHURINE Color Interference (A) 06/14/2021 1456   GLUCOSEU Color Interference (A) 06/14/2021 1456   HGBUR Color Interference (A) 06/14/2021 1456   BILIRUBINUR Color Interference (A) 06/14/2021 1456   KETONESUR Color Interference (A) 06/14/2021 1456   UROBILINOGEN Color Interference (A) 06/14/2021 1456   NITRITE Color Interference (A) 06/14/2021 1456   LEUKOCYTESUR Color Interference (A) 06/14/2021 1456    Radiological Exams on Admission: DG Chest 2 View  Result Date: 06/25/2021 CLINICAL DATA:  Shortness of breath EXAM: CHEST - 2 VIEW COMPARISON:  07/20/2016 FINDINGS: Borderline heart size, likely accentuated by  AP semi upright technique and low lung volumes. Mild pulmonary vascular congestion. Small focal opacity at the right lung base. No pleural effusion or pneumothorax. IMPRESSION: 1. Small focal opacity at the right lung base may reflect atelectasis or pneumonia. Recommend repeat inspiratory PA and lateral radiographs of the chest for further evaluation. 2. Mild pulmonary vascular congestion. Electronically Signed   By: Davina Poke D.O.   On: 06/11/2021 16:50   US Abdomen Limited RUQ (LIVER/GB)  Result Date: 06/13/2021 CLINICAL DATA:  Jaundice and cirrhosis. EXAM: ULTRASOUND ABDOMEN LIMITED RIGHT UPPER QUADRANT COMPARISON:  None. FINDINGS: Gallbladder: No gallstones or wall thickening visualized. No sonographic Murphy sign noted by sonographer. Common bile duct: Diameter: Not well seen.  Approximately 3-4 mm diameter. Liver: Markedly echogenic and heterogeneous liver parenchyma with nodular contour. Portal vein is patent on color Doppler imaging with normal direction of blood flow towards the liver. Other: None. IMPRESSION: Nodular, echogenic liver parenchyma compatible with cirrhosis. No focal abnormality evident although assessment limited due to poor acoustic through transmission. No biliary dilatation Electronically Signed   By: Misty Stanley M.D.   On: 06/28/2021 15:02   IR Paracentesis  Result Date: 07/07/2021 INDICATION: Patient with a history of alcohol abuse and cirrhosis presents today with ascites. Interventional Radiology asked to perform a diagnostic and therapeutic paracentesis EXAM: ULTRASOUND GUIDED PARACENTESIS MEDICATIONS: 1% lidocaine 15 mL COMPLICATIONS: None immediate. PROCEDURE: Informed written consent was obtained from the patient after a discussion of the risks, benefits and alternatives to treatment. A timeout was performed prior to the initiation of the procedure. Initial ultrasound scanning demonstrates a large amount of ascites within the left lower abdominal quadrant. The  left lower abdomen was prepped and draped in the usual sterile fashion. 1% lidocaine was used for local anesthesia. Following this, a 19 gauge, 15-cm, Yueh catheter was introduced. An ultrasound image was saved for documentation purposes. The paracentesis was performed. The catheter was removed and a dressing was applied. The patient tolerated the procedure well without immediate post procedural complication. FINDINGS: A total of approximately 800 mL of orange yellow fluid was removed. Samples were sent to the laboratory as requested by the clinical team. IMPRESSION: Successful ultrasound-guided paracentesis yielding 800 mL of peritoneal fluid. Read by: Soyla Dryer, NP Electronically Signed   By: Ruthann Cancer M.D.   On: 06/26/2021 16:19   Antonieta Pert MD [facility]  If 7PM-7AM, please contact night-coverage [email or web address]  07/06/2021, 5:50 PM

## 2021-07-04 NOTE — ED Provider Notes (Signed)
[facility] EMERGENCY DEPARTMENT Provider Note   CSN: [id number] Arrival date & time: 06/24/2021  1213     History Chief Complaint  Patient presents with   Shortness of Breath   Jaundice    Kristine Sims is a 50 y.o. female with a history of chronic alcohol use presented emergency department of abdominal swelling, jaundice, fatigue, lethargy at home for the past several days or weeks.  Supplemental history provided by the patient's husband at bedside.  He reports that the patient has been a longtime, heavy user of alcohol, drinking perhaps "a half a gallon of liquor" daily.  She does go through withdrawal symptoms when she stops drinking, but he denies that she is ever had a seizure.  He reports that he noticed that she appeared more yellow than jaundice for the past 3 to 4 weeks.  He reports that she does not eat much food, generally just drinks lemonade mixed with vodka.  Their PCP had done blood work and then referred him urgently for a GI evaluation.  They report for the past 2 days she said worsening shortness of breath fatigue.  She has dyspnea with exertion.  She needs to sleep upright because she cannot lie flat.  She reports her abdomen is distended and her legs are also swelling up bilaterally.  HPI     Past Medical History:  Diagnosis Date   Acute recurrent maxillary sinusitis    frequent, sees Dr. Narda Bonds   Allergy    Anxiety    Asthma attack 09/07/2011   Depression    IBS (irritable bowel syndrome)    Migraines    Peripheral neuropathy    per Dr. Dian Situ and Dr. 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     Family History  Problem Relation Age of Onset   Hypertension Other    Heart disease Other    Cancer Mother        lung cancer   Hypertension Father    Heart disease Father    Hyperlipidemia Brother    Hypertension Brother    Mental illness Sister        depression   Hypertension Brother    Asperger's syndrome Son    ADD / ADHD Son     Social History   Tobacco Use   Smoking status: Never   Smokeless tobacco: Never  Vaping Use   Vaping Use: Never used  Substance Use Topics   Alcohol use: Not Currently    Alcohol/week: 14.0 standard drinks    Types: 14 Standard drinks or equivalent per week    Comment: increased from occasionally to cope with anxiety, 2015   Drug use: No    Home Medications Prior to Admission medications   Medication Sig Start Date  End Date Taking? Authorizing Provider  furosemide (LASIX) 40 MG tablet Take 1 tablet (40 mg total) by mouth daily. 06/16/21   Nelwyn Salisbury, MD  Multiple Vitamins-Minerals (MULTIVITAMIN WITH MINERALS) tablet Take 1 tablet by mouth daily. Patient not taking: Reported on 06/14/2021    [provider]  Multiple Vitamins-Minerals (VITAMIN D3 COMPLETE PO) Take by mouth.    [provider]  Vitamin D, Ergocalciferol, (DRISDOL) 1.25 MG (50000 UNIT) CAPS capsule Take 1 capsule (50,000 Units total) by mouth every 7 (seven) days. Patient not taking: Reported on 06/14/2021 06/09/20   Nelwyn Salisbury, MD    Allergies    Amoxicillin, Augmentin [amoxicillin-pot clavulanate], and Oxycodone-acetaminophen  Review of Systems   Review of Systems  Constitutional:  Positive for fatigue. Negative for chills and fever.  HENT:  Negative  for ear pain and sore throat.   Eyes:  Negative for pain and visual disturbance.  Respiratory:  Positive for shortness of breath. Negative for cough.   Cardiovascular:  Negative for chest pain and palpitations.  Gastrointestinal:  Positive for abdominal distention, abdominal pain, nausea and vomiting.  Genitourinary:  Negative for dysuria and hematuria.  Musculoskeletal:  Negative for arthralgias and back pain.  Skin:  Positive for color change.  Neurological:  Negative for syncope and headaches.  All other systems reviewed and are negative.  Physical Exam Updated Vital Signs BP (!) 98/56   Pulse (!) 110   Temp 98 F (36.7 C)   Resp 20   Ht 5\' 3"  (1.6 m)   Wt 104.4 kg   SpO2 93%   BMI 40.77 kg/m   Physical Exam Constitutional:      General: She is not in acute distress. HENT:     Head: Normocephalic and atraumatic.  Eyes:     Conjunctiva/sclera: Conjunctivae normal.     Pupils: Pupils are equal, round, and reactive to light.     Comments: Scleral icterus  Cardiovascular:     Rate and Rhythm: Normal rate and regular rhythm.  Pulmonary:     Effort: Pulmonary effort is normal. No respiratory distress.  Abdominal:     General: There is distension.     Palpations: Abdomen is soft. There is fluid wave.     Tenderness: There is no abdominal tenderness.  Skin:    General: Skin is warm and dry.     Comments: Jaundiced head to toe  Neurological:     General: No focal deficit present.     Mental Status: She is alert and oriented to person, place, and time. Mental status is at baseline.     Comments: Tremors of bilateral hands  Psychiatric:        Mood and Affect: Mood normal.        Behavior: Behavior normal.    ED Results / Procedures / Treatments   Labs (all labs ordered are listed, but only abnormal results are displayed) Labs Reviewed  COMPREHENSIVE METABOLIC PANEL - Abnormal; Notable for the following components:      Result Value   Sodium 116 (*)    Chloride 74  (*)    CO2 21 (*)    BUN 31 (*)    Creatinine, Ser 1.68 (*)    Calcium 8.4 (*)    Albumin 2.4 (*)    AST 232 (*)    ALT 59 (*)    Alkaline Phosphatase 157 (*)    Total Bilirubin 21.7 (*)    GFR, Estimated 36 (*)    Anion  gap 21 (*)    All other components within normal limits  CBC WITH DIFFERENTIAL/PLATELET - Abnormal; Notable for the following components:   WBC 15.6 (*)    RBC 2.18 (*)    Hemoglobin 8.3 (*)    HCT 23.3 (*)    MCV 106.9 (*)    MCH 38.1 (*)    RDW 17.6 (*)    Neutro Abs 12.5 (*)    Monocytes Absolute 1.5 (*)    Abs Immature Granulocytes 0.48 (*)    All other components within normal limits  LIPASE, BLOOD - Abnormal; Notable for the following components:   Lipase 407 (*)    All other components within normal limits  PROTIME-INR - Abnormal; Notable for the following components:   Prothrombin Time 16.3 (*)    INR 1.3 (*)    All other components within normal limits  AMMONIA - Abnormal; Notable for the following components:   Ammonia 75 (*)    All other components within normal limits  BRAIN NATRIURETIC PEPTIDE - Abnormal; Notable for the following components:   B Natriuretic Peptide 149.5 (*)    All other components within normal limits  BODY FLUID CELL COUNT WITH DIFFERENTIAL - Abnormal; Notable for the following components:   Appearance, Fluid CLEAR (*)    Monocyte-Macrophage-Serous Fluid 8 (*)    All other components within normal limits  FOLATE - Abnormal; Notable for the following components:   Folate 4.4 (*)    All other components within normal limits  BASIC METABOLIC PANEL - Abnormal; Notable for the following components:   Sodium 117 (*)    Chloride 74 (*)    CO2 19 (*)    BUN 33 (*)    Creatinine, Ser 1.64 (*)    Calcium 8.3 (*)    GFR, Estimated 37 (*)    Anion gap 24 (*)    All other components within normal limits  COMPREHENSIVE METABOLIC PANEL - Abnormal; Notable for the following components:   Sodium 117 (*)    Chloride 75 (*)     CO2 20 (*)    BUN 36 (*)    Creatinine, Ser 1.94 (*)    Calcium 8.4 (*)    Albumin 2.2 (*)    AST 209 (*)    ALT 58 (*)    Alkaline Phosphatase 148 (*)    Total Bilirubin 22.9 (*)    GFR, Estimated 31 (*)    Anion gap 22 (*)    All other components within normal limits  PROTIME-INR - Abnormal; Notable for the following components:   Prothrombin Time 17.5 (*)    INR 1.4 (*)    All other components within normal limits  CBC - Abnormal; Notable for the following components:   WBC 16.0 (*)    RBC 1.97 (*)    Hemoglobin 7.4 (*)    HCT 21.2 (*)    MCV 107.6 (*)    MCH 37.6 (*)    RDW 17.4 (*)    All other components within normal limits  HEMOGLOBIN AND HEMATOCRIT, BLOOD - Abnormal; Notable for the following components:   Hemoglobin 7.8 (*)    HCT 21.9 (*)    All other components within normal limits  BASIC METABOLIC PANEL - Abnormal; Notable for the following components:   Sodium 117 (*)    Chloride 75 (*)    BUN 41 (*)    Creatinine, Ser 2.22 (*)    Calcium 8.0 (*)    GFR, Estimated 26 (*)  Anion gap 19 (*)    All other components within normal limits  HEMOGLOBIN AND HEMATOCRIT, BLOOD - Abnormal; Notable for the following components:   Hemoglobin 7.1 (*)    HCT 20.3 (*)    All other components within normal limits  I-STAT BETA HCG BLOOD, ED ([facility], [facility], [facility] ONLY) - Abnormal; Notable for the following components:   I-stat hCG, quantitative 5.5 (*)    All other components within normal limits  RESP PANEL BY RT-PCR (FLU A&B, COVID) ARPGX2  CULTURE, BODY FLUID W GRAM STAIN -BOTTLE  GRAM STAIN  ACID FAST CULTURE WITH REFLEXED SENSITIVITIES (MYCOBACTERIA)  ACID FAST SMEAR (AFB, MYCOBACTERIA)  ALBUMIN, PLEURAL OR PERITONEAL FLUID   PROTEIN, PLEURAL OR PERITONEAL FLUID  GLUCOSE, PLEURAL OR PERITONEAL FLUID  VITAMIN B12  TSH  OSMOLALITY  CORTISOL  OSMOLALITY, URINE  NA AND K (SODIUM & POTASSIUM), RAND UR  SODIUM, URINE, RANDOM  CREATININE, URINE, RANDOM  BASIC METABOLIC  PANEL  BASIC METABOLIC PANEL  HEMOGLOBIN AND HEMATOCRIT, BLOOD  CYTOLOGY - NON PAP    EKG EKG Interpretation  Date/Time:  Tuesday July 04 2021 12:29:42 EDT Ventricular Rate:  94 PR Interval:  170 QRS Duration: 88 QT Interval:  392 QTC Calculation: 490 R Axis:   -1 Text Interpretation: Normal sinus rhythm Confirmed by Alvester Chou [phone]) on 06/13/2021 3:34:03 PM  Radiology DG Chest 2 View  Result Date: 07/09/2021 CLINICAL DATA:  Shortness of breath EXAM: CHEST - 2 VIEW COMPARISON:  07/20/2016 FINDINGS: Borderline heart size, likely accentuated by AP semi upright technique and low lung volumes. Mild pulmonary vascular congestion. Small focal opacity at the right lung base. No pleural effusion or pneumothorax. IMPRESSION: 1. Small focal opacity at the right lung base may reflect atelectasis or pneumonia. Recommend repeat inspiratory PA and lateral radiographs of the chest for further evaluation. 2. Mild pulmonary vascular congestion. Electronically Signed   By: Duanne Guess D.O.   On: 06/29/2021 16:50   US RENAL  Result Date: 06/27/2021 CLINICAL DATA:  Initial evaluation for acute kidney failure. EXAM: RENAL / URINARY TRACT ULTRASOUND COMPLETE COMPARISON:  Prior CT from 06/15/2021. FINDINGS: Right Kidney: Renal measurements: 11.2 x 5.0 x 5.5 cm = volume: 116.7 mL. Renal echogenicity within normal limits. No nephrolithiasis or hydronephrosis. No focal renal mass. Left Kidney: Renal measurements: 11.3 x 5.2 x 5.2 cm = volume: 159.3 mL. Renal echogenicity within normal limits. No nephrolithiasis or hydronephrosis. No focal renal mass. Bladder: Appears normal for degree of bladder distention. Other: Trace ascites noted adjacent to the liver within the right upper quadrant. Probable small right pleural effusion noted as well. No new hepatic cirrhosis partially visualized. IMPRESSION: 1. Normal renal ultrasound. No hydronephrosis or other acute abnormality. 2. Trace perihepatic ascites  and small right pleural effusion. 3. Hepatic cirrhosis, partially visualized. Electronically Signed   By: Rise Mu M.D.   On: 07/10/2021 20:44   US Abdomen Limited RUQ (LIVER/GB)  Result Date: 06/10/2021 CLINICAL DATA:  Jaundice and cirrhosis. EXAM: ULTRASOUND ABDOMEN LIMITED RIGHT UPPER QUADRANT COMPARISON:  None. FINDINGS: Gallbladder: No gallstones or wall thickening visualized. No sonographic Murphy sign noted by sonographer. Common bile duct: Diameter: Not well seen.  Approximately 3-4 mm diameter. Liver: Markedly echogenic and heterogeneous liver parenchyma with nodular contour. Portal vein is patent on color Doppler imaging with normal direction of blood flow towards the liver. Other: None. IMPRESSION: Nodular, echogenic liver parenchyma compatible with cirrhosis. No focal abnormality evident although assessment limited due to poor acoustic through transmission. No biliary  dilatation Electronically Signed   By: Kennith Center M.D.   On: 07/10/2021 15:02   IR Paracentesis  Result Date: 06/14/2021 INDICATION: Patient with a history of alcohol abuse and cirrhosis presents today with ascites. Interventional Radiology asked to perform a diagnostic and therapeutic paracentesis EXAM: ULTRASOUND GUIDED PARACENTESIS MEDICATIONS: 1% lidocaine 15 mL COMPLICATIONS: None immediate. PROCEDURE: Informed written consent was obtained from the patient after a discussion of the risks, benefits and alternatives to treatment. A timeout was performed prior to the initiation of the procedure. Initial ultrasound scanning demonstrates a large amount of ascites within the left lower abdominal quadrant. The left lower abdomen was prepped and draped in the usual sterile fashion. 1% lidocaine was used for local anesthesia. Following this, a 19 gauge, 15-cm, Yueh catheter was introduced. An ultrasound image was saved for documentation purposes. The paracentesis was performed. The catheter was removed and a dressing  was applied. The patient tolerated the procedure well without immediate post procedural complication. FINDINGS: A total of approximately 800 mL of orange yellow fluid was removed. Samples were sent to the laboratory as requested by the clinical team. IMPRESSION: Successful ultrasound-guided paracentesis yielding 800 mL of peritoneal fluid. Read by: Alwyn Ren, NP Electronically Signed   By: Marliss Coots M.D.   On: 06/22/2021 16:19    Procedures Procedures   Medications Ordered in ED Medications  sodium chloride 0.9 % bolus 1,000 mL (1,000 mLs Intravenous Not Given 06/19/2021 2324)  pantoprazole (PROTONIX) injection 40 mg (40 mg Intravenous Given 07/09/2021 2153)  famotidine (PEPCID) tablet 10 mg (has no administration in time range)  LORazepam (ATIVAN) tablet 1-4 mg ( Oral See Alternative 07/05/21 0737)    Or  LORazepam (ATIVAN) injection 1-4 mg (1 mg Intravenous Given 07/05/21 0737)  thiamine tablet 100 mg ( Oral See Alternative 07/07/2021 1822)    Or  thiamine (B-1) injection 100 mg (100 mg Intravenous Given 07/09/2021 1822)  folic acid (FOLVITE) tablet 1 mg (1 mg Oral Not Given 07/03/2021 2200)  multivitamin with minerals tablet 1 tablet (1 tablet Oral Not Given 07/03/2021 2328)  prednisoLONE tablet 40 mg (40 mg Oral Not Given 06/18/2021 2200)  ondansetron (ZOFRAN) tablet 4 mg ( Oral See Alternative 06/24/2021 1819)    Or  ondansetron (ZOFRAN) injection 4 mg (4 mg Intravenous Given 06/10/2021 1819)  albuterol (PROVENTIL) (2.5 MG/3ML) 0.083% nebulizer solution 2.5 mg (has no administration in time range)  alum & mag hydroxide-simeth (MAALOX/MYLANTA) 200-200-20 MG/5ML suspension 15 mL (has no administration in time range)  furosemide (LASIX) injection 80 mg (80 mg Intravenous Given 07/05/21 0737)  cefTRIAXone (ROCEPHIN) 2 g in sodium chloride 0.9 % 100 mL IVPB (2 g Intravenous New Bag/Given 07/05/21 0810)  midodrine (PROAMATINE) tablet 10 mg (has no administration in time range)  octreotide  (SANDOSTATIN) injection 200 mcg (has no administration in time range)  albumin human 25 % solution 25 g (has no administration in time range)    ED Course  I have reviewed the triage vital signs and the nursing notes.  Pertinent labs & imaging results that were available during my care of the patient were reviewed by me and considered in my medical decision making (see chart for details).  Patient is here with concern for alcohol withdrawal and liver failure/cirrhosis.  Patient is jaundiced on arrival demonstrating signs of mild to moderate alcohol withdrawal.  He was started on the CIWA protocol.  Supplemental history is provided by the patient's husband at bedside.  Patient retrocardial ultrasound ordered at intake showing  likely signs of cirrhosis, no acute biliary disease.  She subsequently was sent to interventional radiology and had about 800 cc of yellow fluid removed and paracentesis from the abdomen, which will be sent for sampling.  My exam she does not have evidence of SBP, sepsis, or encephalopathy.  She does have some mild confusion per her husband's history.  She appears slightly drowsy on exam.  I personally reviewed interpreted her labs are notable for elevated lipase at 407, likely alcohol related pancreatitis.  She also has hyponatremia with sodium of 116, albumin low at 2.4, mild transaminitis, T bili significant elevated 21.7.  She has an anion gap of 21.  Also has some elevation of her creatinine at 1.6 today.  Hemoglobin is 8.3, likely some suppression due to liver disease.  Patient given 1 L of IV fluids for hyponatremia and intrasvascular volume depletion  Gastroenterology was consulted. She will require hospitalization.  Both the patient and her husband verbalized understanding and agreement with this plan.  Clinical Course as of 07/05/21 1008  Tue Jul 22, 2021  1603 Spoke to GI, consult placed [MT]  1638 IMPRESSION: Successful ultrasound-guided paracentesis  yielding 800 mL of peritoneal fluid. Read by: Alwyn Ren, N [MT]  1640 Started on CIWA protocol, will consult hospitalist for admission. [MT]  1715 Nephrology Dr Juel Burrow consulted regarding hyponatremia; he will see patient.  Patient admitted to hospitalist. [MT]    Clinical Course User Index [MT] Jenisa Monty, Kermit Balo, MD    Final Clinical Impression(s) / ED Diagnoses Final diagnoses:  Cirrhosis (HCC)  Jaundice    Rx / DC Orders ED Discharge Orders     None        Terald Sleeper, MD 07/05/21 1009

## 2021-07-05 ENCOUNTER — Inpatient Hospital Stay (HOSPITAL_COMMUNITY): Payer: BC Managed Care – PPO

## 2021-07-05 DIAGNOSIS — K7011 Alcoholic hepatitis with ascites: Secondary | ICD-10-CM | POA: Diagnosis not present

## 2021-07-05 DIAGNOSIS — F1010 Alcohol abuse, uncomplicated: Secondary | ICD-10-CM

## 2021-07-05 DIAGNOSIS — K7682 Hepatic encephalopathy: Secondary | ICD-10-CM

## 2021-07-05 DIAGNOSIS — E871 Hypo-osmolality and hyponatremia: Secondary | ICD-10-CM | POA: Diagnosis not present

## 2021-07-05 DIAGNOSIS — K7031 Alcoholic cirrhosis of liver with ascites: Secondary | ICD-10-CM

## 2021-07-05 DIAGNOSIS — R17 Unspecified jaundice: Secondary | ICD-10-CM | POA: Diagnosis not present

## 2021-07-05 DIAGNOSIS — N179 Acute kidney failure, unspecified: Secondary | ICD-10-CM | POA: Diagnosis not present

## 2021-07-05 DIAGNOSIS — R7989 Other specified abnormal findings of blood chemistry: Secondary | ICD-10-CM | POA: Diagnosis not present

## 2021-07-05 DIAGNOSIS — D689 Coagulation defect, unspecified: Secondary | ICD-10-CM

## 2021-07-05 DIAGNOSIS — R0602 Shortness of breath: Secondary | ICD-10-CM

## 2021-07-05 DIAGNOSIS — K7200 Acute and subacute hepatic failure without coma: Secondary | ICD-10-CM | POA: Diagnosis not present

## 2021-07-05 DIAGNOSIS — R748 Abnormal levels of other serum enzymes: Secondary | ICD-10-CM

## 2021-07-05 DIAGNOSIS — R34 Anuria and oliguria: Secondary | ICD-10-CM

## 2021-07-05 LAB — PROTIME-INR
INR: 1.4 — ABNORMAL HIGH (ref 0.8–1.2)
Prothrombin Time: 17.5 seconds — ABNORMAL HIGH (ref 11.4–15.2)

## 2021-07-05 LAB — HEMOGLOBIN AND HEMATOCRIT, BLOOD
HCT: 20.3 % — ABNORMAL LOW (ref 36.0–46.0)
HCT: 19.9 % — ABNORMAL LOW (ref 36.0–46.0)
Hemoglobin: 7.1 g/dL — ABNORMAL LOW (ref 12.0–15.0)
Hemoglobin: 7.2 g/dL — ABNORMAL LOW (ref 12.0–15.0)

## 2021-07-05 LAB — ECHOCARDIOGRAM COMPLETE
AR max vel: 2.34 cm2
AV Area VTI: 2.56 cm2
AV Area mean vel: 2.43 cm2
AV Mean grad: 14 mmHg
AV Peak grad: 25.1 mmHg
Ao pk vel: 2.51 m/s
Area-P 1/2: 6.48 cm2
Calc EF: 68.1 %
Height: 63 in
S' Lateral: 2.5 cm
Single Plane A2C EF: 67.1 %
Single Plane A4C EF: 68.4 %
Weight: 3682.56 oz

## 2021-07-05 LAB — BASIC METABOLIC PANEL
Anion gap: 19 — ABNORMAL HIGH (ref 5–15)
Anion gap: 18 — ABNORMAL HIGH (ref 5–15)
Anion gap: 21 — ABNORMAL HIGH (ref 5–15)
BUN: 41 mg/dL — ABNORMAL HIGH (ref 6–20)
BUN: 43 mg/dL — ABNORMAL HIGH (ref 6–20)
BUN: 47 mg/dL — ABNORMAL HIGH (ref 6–20)
CO2: 23 mmol/L (ref 22–32)
CO2: 23 mmol/L (ref 22–32)
CO2: 23 mmol/L (ref 22–32)
Calcium: 8 mg/dL — ABNORMAL LOW (ref 8.9–10.3)
Calcium: 8.1 mg/dL — ABNORMAL LOW (ref 8.9–10.3)
Calcium: 8.4 mg/dL — ABNORMAL LOW (ref 8.9–10.3)
Chloride: 75 mmol/L — ABNORMAL LOW (ref 98–111)
Chloride: 76 mmol/L — ABNORMAL LOW (ref 98–111)
Chloride: 74 mmol/L — ABNORMAL LOW (ref 98–111)
Creatinine, Ser: 2.22 mg/dL — ABNORMAL HIGH (ref 0.44–1.00)
Creatinine, Ser: 2.37 mg/dL — ABNORMAL HIGH (ref 0.44–1.00)
Creatinine, Ser: 2.41 mg/dL — ABNORMAL HIGH (ref 0.44–1.00)
GFR, Estimated: 26 mL/min — ABNORMAL LOW (ref >60)
GFR, Estimated: 24 mL/min — ABNORMAL LOW (ref >60)
GFR, Estimated: 24 mL/min — ABNORMAL LOW (ref >60)
Glucose, Bld: 84 mg/dL (ref 70–99)
Glucose, Bld: 88 mg/dL (ref 70–99)
Glucose, Bld: 98 mg/dL (ref 70–99)
Potassium: 4.6 mmol/L (ref 3.5–5.1)
Potassium: 4.7 mmol/L (ref 3.5–5.1)
Potassium: 5 mmol/L (ref 3.5–5.1)
Sodium: 117 mmol/L — CL (ref 135–145)
Sodium: 117 mmol/L — CL (ref 135–145)
Sodium: 118 mmol/L — CL (ref 135–145)

## 2021-07-05 LAB — CBC
HCT: 21.2 % — ABNORMAL LOW (ref 36.0–46.0)
Hemoglobin: 7.4 g/dL — ABNORMAL LOW (ref 12.0–15.0)
MCH: 37.6 pg — ABNORMAL HIGH (ref 26.0–34.0)
MCHC: 34.9 g/dL (ref 30.0–36.0)
MCV: 107.6 fL — ABNORMAL HIGH (ref 80.0–100.0)
Platelets: 156 10*3/uL (ref 150–400)
RBC: 1.97 MIL/uL — ABNORMAL LOW (ref 3.87–5.11)
RDW: 17.4 % — ABNORMAL HIGH (ref 11.5–15.5)
WBC: 16 10*3/uL — ABNORMAL HIGH (ref 4.0–10.5)
nRBC: 0.2 % (ref 0.0–0.2)

## 2021-07-05 LAB — COMPREHENSIVE METABOLIC PANEL
ALT: 58 U/L — ABNORMAL HIGH (ref 0–44)
AST: 209 U/L — ABNORMAL HIGH (ref 15–41)
Albumin: 2.2 g/dL — ABNORMAL LOW (ref 3.5–5.0)
Alkaline Phosphatase: 148 U/L — ABNORMAL HIGH (ref 38–126)
Anion gap: 22 — ABNORMAL HIGH (ref 5–15)
BUN: 36 mg/dL — ABNORMAL HIGH (ref 6–20)
CO2: 20 mmol/L — ABNORMAL LOW (ref 22–32)
Calcium: 8.4 mg/dL — ABNORMAL LOW (ref 8.9–10.3)
Chloride: 75 mmol/L — ABNORMAL LOW (ref 98–111)
Creatinine, Ser: 1.94 mg/dL — ABNORMAL HIGH (ref 0.44–1.00)
GFR, Estimated: 31 mL/min — ABNORMAL LOW (ref >60)
Glucose, Bld: 73 mg/dL (ref 70–99)
Potassium: 4.4 mmol/L (ref 3.5–5.1)
Sodium: 117 mmol/L — CL (ref 135–145)
Total Bilirubin: 22.9 mg/dL (ref 0.3–1.2)
Total Protein: 7.2 g/dL (ref 6.5–8.1)

## 2021-07-05 LAB — IRON AND TIBC
Iron: 112 ug/dL (ref 28–170)
Saturation Ratios: 100 % — ABNORMAL HIGH (ref 10.4–31.8)
TIBC: 112 ug/dL — ABNORMAL LOW (ref 250–450)
UIBC: 0 ug/dL

## 2021-07-05 LAB — HEPATITIS B CORE ANTIBODY, IGM: Hep B C IgM: NONREACTIVE

## 2021-07-05 LAB — HEPATITIS A ANTIBODY, IGM: Hep A IgM: NONREACTIVE

## 2021-07-05 LAB — HEPATITIS B SURFACE ANTIGEN: Hepatitis B Surface Ag: NONREACTIVE

## 2021-07-05 LAB — HEPATITIS A ANTIBODY, TOTAL: hep A Total Ab: NONREACTIVE

## 2021-07-05 MED ORDER — LACTULOSE ENEMA
300.0000 mL | Freq: Once | ORAL | Status: DC
  Filled 2021-07-05: qty 300

## 2021-07-05 MED ORDER — NEPRO/CARBSTEADY PO LIQD
237.0000 mL | Freq: Three times a day (TID) | ORAL | Status: DC
  Administered 2021-07-06: 237 mL via ORAL

## 2021-07-05 MED ORDER — SODIUM CHLORIDE 0.9 % IV SOLN
2.0000 g | INTRAVENOUS | Status: DC
  Administered 2021-07-05: 2 g via INTRAVENOUS
  Filled 2021-07-05: qty 20

## 2021-07-05 MED ORDER — OCTREOTIDE ACETATE 100 MCG/ML IJ SOLN
200.0000 ug | Freq: Three times a day (TID) | INTRAMUSCULAR | Status: DC
  Administered 2021-07-05 – 2021-07-06 (×4): 200 ug via SUBCUTANEOUS
  Filled 2021-07-05 (×8): qty 2

## 2021-07-05 MED ORDER — RIFAXIMIN 550 MG PO TABS
550.0000 mg | ORAL_TABLET | Freq: Two times a day (BID) | ORAL | Status: DC
  Administered 2021-07-06: 550 mg via ORAL
  Filled 2021-07-05 (×4): qty 1

## 2021-07-05 MED ORDER — PERFLUTREN LIPID MICROSPHERE
1.0000 mL | INTRAVENOUS | Status: AC | PRN
  Administered 2021-07-05: 2 mL via INTRAVENOUS
  Filled 2021-07-05: qty 10

## 2021-07-05 MED ORDER — LACTULOSE 10 GM/15ML PO SOLN
30.0000 g | Freq: Three times a day (TID) | ORAL | Status: DC
  Administered 2021-07-05 – 2021-07-06 (×4): 30 g via ORAL
  Filled 2021-07-05 (×5): qty 45

## 2021-07-05 MED ORDER — MIDODRINE HCL 5 MG PO TABS
10.0000 mg | ORAL_TABLET | Freq: Three times a day (TID) | ORAL | Status: DC
  Administered 2021-07-05 – 2021-07-06 (×5): 10 mg via ORAL
  Filled 2021-07-05 (×5): qty 2

## 2021-07-05 MED ORDER — ALBUMIN HUMAN 25 % IV SOLN
25.0000 g | Freq: Three times a day (TID) | INTRAVENOUS | Status: DC
  Administered 2021-07-05 – 2021-07-06 (×4): 25 g via INTRAVENOUS
  Filled 2021-07-05 (×4): qty 100

## 2021-07-05 MED ORDER — ORAL CARE MOUTH RINSE
15.0000 mL | Freq: Two times a day (BID) | OROMUCOSAL | Status: DC
  Administered 2021-07-05 – 2021-07-06 (×2): 15 mL via OROMUCOSAL

## 2021-07-05 MED ORDER — RENA-VITE PO TABS
1.0000 | ORAL_TABLET | Freq: Every day | ORAL | Status: DC
  Filled 2021-07-05: qty 1

## 2021-07-05 NOTE — Progress Notes (Signed)
Progress Note   Subjective  Chief Complaint: Acute alcoholic hepatitis with hepatic encephalopathy and HRS  This morning the patient is really unable to answer many of my questions as she is just in and out of sleep.  Her husband is tearful by her bedside asking what else there is to be done.  Apparently she only urinated once overnight and she was quite delirious.  Asks what plans are going forward.  She continues to complain of generalized abdominal pain as well as back pain.  Her husband tells me she had 2 back surgeries years ago and " these beds do not help".  He also tells me "they do not want to put her on pain meds because of everything else".   Objective   Vital signs in last 24 hours: Temp:  [98 F (36.7 C)-98.5 F (36.9 C)] 98 F (36.7 C) (10/26 0810) Pulse Rate:  [91-114] 110 (10/26 0810) Resp:  [18-28] 20 (10/26 0810) BP: (98-124)/(52-76) 98/56 (10/26 0810) SpO2:  [92 %-98 %] 93 % (10/26 0800) Weight:  [104.4 kg] 104.4 kg (10/26 0100)   General: Very ill-appearing, jaundiced female  Heart:  Regular rate and rhythm; no murmurs Lungs: Respirations even and unlabored, lungs CTA bilaterally Abdomen: Tense, mild generalized TTP, moderate distention, decreased bowel sounds all 4 quadrants Extremities: Marked bilateral pitting lower extremity edema Neurologic: Lethargic Psych: Lethargic   Lab Results: Recent Labs    06/22/2021 1310 06/18/2021 2205 07/05/21 0201 07/05/21 0731  WBC 15.6*  --  16.0*  --   HGB 8.3* 7.8* 7.4* 7.1*  HCT 23.3* 21.9* 21.2* 20.3*  PLT 172  --  156  --    BMET Recent Labs    06/27/2021 1813 07/05/21 0201 07/05/21 0731  NA 117* 117* 117*  K 4.2 4.4 4.6  CL 74* 75* 75*  CO2 19* 20* 23  GLUCOSE 74 73 84  BUN 33* 36* 41*  CREATININE 1.64* 1.94* 2.22*  CALCIUM 8.3* 8.4* 8.0*   LFT Recent Labs    07/05/21 0201  PROT 7.2  ALBUMIN 2.2*  AST 209*  ALT 58*  ALKPHOS 148*  BILITOT 22.9*   PT/INR Recent Labs    07/03/2021 1310  07/05/21 0201  LABPROT 16.3* 17.5*  INR 1.3* 1.4*    Studies/Results: DG Chest 2 View  Result Date: 07/02/2021 CLINICAL DATA:  Shortness of breath EXAM: CHEST - 2 VIEW COMPARISON:  07/20/2016 FINDINGS: Borderline heart size, likely accentuated by AP semi upright technique and low lung volumes. Mild pulmonary vascular congestion. Small focal opacity at the right lung base. No pleural effusion or pneumothorax. IMPRESSION: 1. Small focal opacity at the right lung base may reflect atelectasis or pneumonia. Recommend repeat inspiratory PA and lateral radiographs of the chest for further evaluation. 2. Mild pulmonary vascular congestion. Electronically Signed   By: Duanne Guess D.O.   On: 06/13/2021 16:50   US RENAL  Result Date: 07/02/2021 CLINICAL DATA:  Initial evaluation for acute kidney failure. EXAM: RENAL / URINARY TRACT ULTRASOUND COMPLETE COMPARISON:  Prior CT from 06/15/2021. FINDINGS: Right Kidney: Renal measurements: 11.2 x 5.0 x 5.5 cm = volume: 116.7 mL. Renal echogenicity within normal limits. No nephrolithiasis or hydronephrosis. No focal renal mass. Left Kidney: Renal measurements: 11.3 x 5.2 x 5.2 cm = volume: 159.3 mL. Renal echogenicity within normal limits. No nephrolithiasis or hydronephrosis. No focal renal mass. Bladder: Appears normal for degree of bladder distention. Other: Trace ascites noted adjacent to the liver within the right upper quadrant.  Probable small right pleural effusion noted as well. No new hepatic cirrhosis partially visualized. IMPRESSION: 1. Normal renal ultrasound. No hydronephrosis or other acute abnormality. 2. Trace perihepatic ascites and small right pleural effusion. 3. Hepatic cirrhosis, partially visualized. Electronically Signed   By: Rise Mu M.D.   On: 07/14/21 20:44   US Abdomen Limited RUQ (LIVER/GB)  Result Date: July 14, 2021 CLINICAL DATA:  Jaundice and cirrhosis. EXAM: ULTRASOUND ABDOMEN LIMITED RIGHT UPPER QUADRANT  COMPARISON:  None. FINDINGS: Gallbladder: No gallstones or wall thickening visualized. No sonographic Murphy sign noted by sonographer. Common bile duct: Diameter: Not well seen.  Approximately 3-4 mm diameter. Liver: Markedly echogenic and heterogeneous liver parenchyma with nodular contour. Portal vein is patent on color Doppler imaging with normal direction of blood flow towards the liver. Other: None. IMPRESSION: Nodular, echogenic liver parenchyma compatible with cirrhosis. No focal abnormality evident although assessment limited due to poor acoustic through transmission. No biliary dilatation Electronically Signed   By: Kennith Center M.D.   On: 07/14/21 15:02   IR Paracentesis  Result Date: 14-Jul-2021 INDICATION: Patient with a history of alcohol abuse and cirrhosis presents today with ascites. Interventional Radiology asked to perform a diagnostic and therapeutic paracentesis EXAM: ULTRASOUND GUIDED PARACENTESIS MEDICATIONS: 1% lidocaine 15 mL COMPLICATIONS: None immediate. PROCEDURE: Informed written consent was obtained from the patient after a discussion of the risks, benefits and alternatives to treatment. A timeout was performed prior to the initiation of the procedure. Initial ultrasound scanning demonstrates a large amount of ascites within the left lower abdominal quadrant. The left lower abdomen was prepped and draped in the usual sterile fashion. 1% lidocaine was used for local anesthesia. Following this, a 19 gauge, 15-cm, Yueh catheter was introduced. An ultrasound image was saved for documentation purposes. The paracentesis was performed. The catheter was removed and a dressing was applied. The patient tolerated the procedure well without immediate post procedural complication. FINDINGS: A total of approximately 800 mL of orange yellow fluid was removed. Samples were sent to the laboratory as requested by the clinical team. IMPRESSION: Successful ultrasound-guided paracentesis yielding 800  mL of peritoneal fluid. Read by: Alwyn Ren, NP Electronically Signed   By: Marliss Coots M.D.   On: 07-14-2021 16:19      Assessment / Plan:   Assessment: 1.  Acute alcoholic hepatitis: With underlying cirrhosis, worsening ascites over the past few weeks of right upper quadrant ultrasound showing patent portal vein, status post paracentesis 10/25 with 800 mL of orange/yellow fluid removed, Maddrey score 39, meld plus NA 32 at admission 2.  Alcoholic cirrhosis 3.  Hepatic encephalopathy 4.  Alcohol abuse  Plan: 1.  Continue Prednisolone 40 mg daily x28 days total 2.  Patient about to get her echo when I leave the room 3.  Continue CIWA protocol 4.  Had a discussion with the patient's husband that she is very ill.  It can take some time for current measures to start working, if they do.  It usually takes days before we can tell if the patient will turn the corner or not.  Briefly discussed kidney function problems and how these are related to her alcoholic hepatitis. Tried to answer all of his questions. 5.  Continue Pantoprazole 40 mg IV twice daily 6.  Will start Lactulose 30 mL 3 times daily and Rifaximin 550 mg twice daily 7.  Appreciate nephrology's recommendations and assistance 8.  Continue to defer correction of sodium to the medicine team/nephrology  Thank you for your kind consultation, we  will continue to follow.   LOS: 1 day   Unk Lightning  07/05/2021, 10:47 AM

## 2021-07-05 NOTE — Plan of Care (Signed)
  Problem: Education: Goal: Knowledge of General Education information will improve Description Including pain rating scale, medication(s)/side effects and non-pharmacologic comfort measures Outcome: Progressing   

## 2021-07-05 NOTE — Evaluation (Addendum)
Clinical/Bedside Swallow Evaluation Patient Details  Name: Kristine Sims MRN: [id number] Date of Birth: December 20, 1970  Today's Date: 07/05/2021 Time: SLP Start Time (ACUTE ONLY): 1200 SLP Stop Time (ACUTE ONLY): 1220 SLP Time Calculation (min) (ACUTE ONLY): 20 min  Past Medical History:  Past Medical History:  Diagnosis Date   Acute recurrent maxillary sinusitis    frequent, sees Dr. Narda Bonds   Allergy    Anxiety    Asthma attack 09/07/2011   Depression    IBS (irritable bowel syndrome)    Migraines    Peripheral neuropathy    per Dr. Dian Situ and Dr. Vear Clock   Past Surgical History:  Past Surgical History:  Procedure Laterality Date   CESAREAN SECTION     IR PARACENTESIS  06/25/2021   LUMBAR DISC SURGERY     NASAL SEPTUM SURGERY     HPI:  50 y.o. female with medical history significant for chronic alcohol abuse, anxiety/depression, alcoholic neuropathy, migraine, IBS presents to the ED with complaint of abdominal swelling, jaundice fatigue and lethargy for past several weeks. Dx liver cirrhosis, acute alcoholic hepatitis, ascites, portal venous HTN, hepatic steatosis.  GI following. Swallow eval ordered due to coughing with POs.    Assessment / Plan / Recommendation  Clinical Impression  Pt participated in limited clinical swallow assessment. She followed simple commands intermittently; groggy; kept eyes closed for duration of eval.  She accepted ice chips and consumed small sips of water with no s/s of aspiration, but c/o pressure and globus after only minimal intake and notable belching.   As long as she is alert, she appears to be protecting her airway during the act of swallowing; however she may be experiencing esophageal issues related to condition that are interfering with PO transition/comfort.  Recommend full liquids as tolerated and as mental status allows. D/W RN and her husband.No further SLP needs are identified - advance diet as tolerated. SLP Visit Diagnosis:  Dysphagia, unspecified (R13.10)    Aspiration Risk    minimal   Diet Recommendation   Full liquids  Medication Administration: Whole meds with liquid (crush if needed)    Other  Recommendations Oral Care Recommendations: Oral care BID    Recommendations for follow up therapy are one component of a multi-disciplinary discharge planning process, led by the attending physician.  Recommendations may be updated based on patient status, additional functional criteria and insurance authorization.  Follow up Recommendations None                  Prognosis Prognosis for Safe Diet Advancement: Good      Swallow Study   General Date of Onset: 06/30/2021 HPI: 50 y.o. female with medical history significant for chronic alcohol abuse, anxiety/depression, alcoholic neuropathy, migraine, IBS presents to the ED with complaint of abdominal swelling, jaundice fatigue and lethargy for past several weeks. Dx liver cirrhosis, acute alcoholic hepatitis, ascites, portal venous HTN, hepatic steatosis.  GI following. Swallow eval ordered due to coughing with POs. Type of Study: Bedside Swallow Evaluation Previous Swallow Assessment: no Diet Prior to this Study: Regular;Thin liquids Temperature Spikes Noted: No Respiratory Status: Room air (RR ~ 30) History of Recent Intubation: No Behavior/Cognition: Lethargic/Drowsy Oral Cavity Assessment: Within Functional Limits Oral Care Completed by SLP: No Oral Cavity - Dentition: Adequate natural dentition Self-Feeding Abilities: Needs assist Patient Positioning: Upright in bed Baseline Vocal Quality: Normal Volitional Cough: Cognitively unable to elicit Volitional Swallow: Able to elicit    Oral/Motor/Sensory Function Overall Oral Motor/Sensory Function: Within  functional limits   Ice Chips Ice chips: Within functional limits   Thin Liquid Thin Liquid: Within functional limits    Nectar Thick Nectar Thick Liquid: Not tested   Honey Thick Honey Thick  Liquid: Not tested   Puree Puree: Not tested   Solid    Ewart Carrera L. Samson Frederic, MA CCC/SLP Acute Rehabilitation Services Office number [phone] Pager [phone]  Solid: Not tested      Blenda Mounts Laurice 07/05/2021,1:19 PM

## 2021-07-05 NOTE — Progress Notes (Signed)
PROGRESS NOTE  Kristine Sims BWG:[id number] DOB: 1971/08/06   PCP: Nelwyn Salisbury, MD  Patient is from: Home.  Lives with family.  DOA: 07/10/2021 LOS: 1  Chief complaints:  Chief Complaint  Patient presents with   Shortness of Breath   Jaundice     Brief Narrative / Interim history: 50 year old F with PMH of EtOH abuse, alcoholic neuropathy, anxiety, depression, migraine and IBS presenting with abdominal swelling, jaundice, lethargy, SOB, DOE and edema, and admitted for decompensated liver cirrhosis with ascites, AKI, encephalopathy and hyperbilirubinemia.  GI and nephrology consulted.  She underwent paracentesis with removal of 900 cc fluid.  Fluid study concerning for SBP.  Started on IV ceftriaxone.  GI and nephrology following.  Subjective: Seen and examined earlier this morning.  Patient is somewhat altered and confused.  Patient's husband at bedside.  Husband seems to understand the severity of illness and grim prognosis.  She has been anuric.   Objective: Vitals:   07/05/21 1200 07/05/21 1300 07/05/21 1400 07/05/21 1530  BP: 91/66 (!) 104/53 (!) 114/93 (!) 112/58  Pulse: (!) 115 (!) 113 (!) 114 (!) 109  Resp: (!) 36 (!) 35 (!) 38 (!) 29  Temp:  98.5 F (36.9 C)  99.9 F (37.7 C)  TempSrc:    Oral  SpO2: 93% 95% 94% 94%  Weight:      Height:        Intake/Output Summary (Last 24 hours) at 07/05/2021 1734 Last data filed at 07/05/2021 0810 Gross per 24 hour  Intake 0 ml  Output --  Net 0 ml   Filed Weights   07/05/21 0100  Weight: 104.4 kg    Examination:  GENERAL: No apparent distress.  Nontoxic. HEENT: MMM.  Sclerae icteric. NECK: Supple.  No apparent JVD.  RESP: 94% on 2 L.  No IWOB.  Fair aeration bilaterally. CVS:  RRR. Heart sounds normal.  ABD/GI/GU: BS+.  Significant abdominal distention.  Diffuse tenderness. MSK/EXT:  Moves extremities. No apparent deformity. No edema.  SKIN: Jaundice all over. NEURO: Awake but not alert.  Somewhat confused.   No apparent focal neuro deficit. PSYCH: Calm. Normal affect.   Procedures:  10/25-paracentesis with removal of 900 cc fluid.  Microbiology summarized: COVID-19 and influenza PCR nonreactive. Peritoneal fluid culture NGTD.  Assessment & Plan: Decompensated alcoholic liver cirrhosis with ascites and portal hypertension Elevated liver enzymes/hyperbilirubinemia Hepatic encephalopathy Alcohol abuse/dependence Coagulopathy -S/p paracentesis with removal of 900 cc fluid.  No cell count.  GS with GPC but culture negative. -MELD-Na score of 34 points suggesting 65 to 66% 90-day mortality -Continue PPI, prednisone and rifaximin per GI -Continue CIWA with as needed Ativan -Continue supportive care and monitoring -Continue lactulose -Very sick.  Grim prognosis.  Consult palliative care   Severe hypochloremic hyponatremia: Likely due to alcohol, cirrhosis and renal failure.   -Nephrology following  -Monitor   Acute metabolic encephalopathy: She is awake but not quite alert.  Seems to be more confused.  Suspect hepatic encephalopathy.  Hyponatremia could contribute -Continue lactulose -Aspiration precaution -Appreciate input by SLP-full liquid diet  AKI/azotemia/anuria-concern for hepatorenal syndrome Anion gap metabolic acidosis: Likely due to the above. Recent Labs    06/24/2021 1310 06/13/2021 1813 07/05/21 0201 07/05/21 0731 07/05/21 1156 07/05/21 1556  BUN 31* 33* 36* 41* 43* 47*  CREATININE 1.68* 1.64* 1.94* 2.22* 2.37* 2.41*  -On octreotide, albumin and IV Lasix per nephrology -Nephrology following.   Possible Pancreatitis/ elevated lipase: Lipase elevated to 407.  RUQ ultrasound without gallstone -Continue  trending.  Leukocytosis at 15.6k: could be demargination.  Peritoneal fluid analysis does not suggest SBP.  Doubt pneumonia or UTI. -Continue monitoring   Macrocytic anemia: Likely due to alcohol liver cirrhosis.  Hgb dropped about 4 g over the last 3 weeks. 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 Nutrition Problem: Inadequate oral intake Etiology: lethargy/confusion Signs/Symptoms: per patient/family report Interventions: Nepro shake, MVI   DVT prophylaxis:  SCDs Start: 06/10/2021 1747  Code Status: Full code Family Communication: Updated patient's husband at bedside Level of care: Progressive Status is: Inpatient  Remains inpatient appropriate because: Decompensated liver failure with acute encephalopathy, AKI and hepatorenal syndrome, hyponatremia       Consultants:  Gastroenterology Nephrology Palliative medicine   Sch Meds:  Scheduled Meds:  feeding supplement (NEPRO CARB STEADY)  237 mL Oral TID BM   folic acid  1 mg Oral Daily   furosemide  80 mg Intravenous Q12H   lactulose  30 g Oral TID   lactulose  300 mL Rectal Once   midodrine  10 mg Oral TID WC   multivitamin  1 tablet Oral QHS   octreotide  200 mcg Subcutaneous Q8H   pantoprazole (PROTONIX) IV  40 mg Intravenous Q12H   prednisoLONE  40 mg Oral Daily   rifaximin  550 mg Oral BID   thiamine  100 mg Oral Daily   Or   thiamine  100 mg Intravenous Daily   Continuous Infusions:  albumin human 25 g (07/05/21 1417)   cefTRIAXone (ROCEPHIN)  IV 2 g (07/05/21 0810)   sodium chloride     PRN Meds:.albuterol, alum & mag hydroxide-simeth, famotidine, LORazepam **OR** LORazepam, ondansetron **OR** ondansetron (ZOFRAN) IV  Antimicrobials: Anti-infectives (From admission, onward)    Start     Dose/Rate Route Frequency Ordered  Stop   07/05/21 1145  rifaximin (XIFAXAN) tablet 550 mg        550 mg Oral 2 times daily 07/05/21 1056     07/05/21 0815  cefTRIAXone (ROCEPHIN) 2 g in sodium chloride 0.9 % 100 mL IVPB        2 g 200 mL/hr over 30 Minutes Intravenous Every 24 hours 07/05/21 0728          I have personally reviewed the following labs and images: CBC: Recent Labs  Lab 06/10/2021 1310 06/26/2021 2205 07/05/21 0201 07/05/21 0731 07/05/21 1556  WBC 15.6*  --  16.0*  --   --   NEUTROABS 12.5*  --   --   --   --   HGB 8.3* 7.8* 7.4* 7.1* 7.2*  HCT 23.3* 21.9* 21.2* 20.3* 19.9*  MCV 106.9*  --  107.6*  --   --   PLT 172  --  156  --   --    BMP &GFR Recent Labs  Lab 07/07/2021 1813 07/05/21 0201 07/05/21 0731 07/05/21 1156 07/05/21 1556  NA 117* 117* 117* 117* 118*  K 4.2 4.4 4.6 4.7 5.0  CL 74* 75* 75* 76* 74*  CO2 19* 20* 23 23 23   GLUCOSE 74 73 84 88 98  BUN 33*  36* 41* 43* 47*  CREATININE 1.64* 1.94* 2.22* 2.37* 2.41*  CALCIUM 8.3* 8.4* 8.0* 8.1* 8.4*   Estimated Creatinine Clearance: 31.6 mL/min (A) (by C-G formula based on SCr of 2.41 mg/dL (H)). Liver & Pancreas: Recent Labs  Lab 16-Jul-2021 1310 07/05/21 0201  AST 232* 209*  ALT 59* 58*  ALKPHOS 157* 148*  BILITOT 21.7* 22.9*  PROT 7.7 7.2  ALBUMIN 2.4* 2.2*   Recent Labs  Lab 07/16/21 1310  LIPASE 407*   Recent Labs  Lab July 16, 2021 1813  AMMONIA 75*   Diabetic: No results for input(s): HGBA1C in the last 72 hours. No results for input(s): GLUCAP in the last 168 hours. Cardiac Enzymes: No results for input(s): CKTOTAL, CKMB, CKMBINDEX, TROPONINI in the last 168 hours. Recent Labs    06/14/21 1456  PROBNP 41.0   Coagulation Profile: Recent Labs  Lab 07-16-21 1310 07/05/21 0201  INR 1.3* 1.4*   Thyroid Function Tests: Recent Labs    07/16/2021 1813  TSH 3.263   Lipid Profile: No results for input(s): CHOL, HDL, LDLCALC, TRIG, CHOLHDL, LDLDIRECT in the last 72 hours. Anemia Panel: Recent Labs     07/16/21 1813 07/05/21 1156  VITAMINB12 752  --   FOLATE 4.4*  --   TIBC  --  112*  IRON  --  112   Urine analysis:    Component Value Date/Time   COLORURINE Orange (A) 06/14/2021 1456   APPEARANCEUR Sl Cloudy (A) 06/14/2021 1456   LABSPEC Color Interference (A) 06/14/2021 1456   PHURINE Color Interference (A) 06/14/2021 1456   GLUCOSEU Color Interference (A) 06/14/2021 1456   HGBUR Color Interference (A) 06/14/2021 1456   BILIRUBINUR Color Interference (A) 06/14/2021 1456   KETONESUR Color Interference (A) 06/14/2021 1456   UROBILINOGEN Color Interference (A) 06/14/2021 1456   NITRITE Color Interference (A) 06/14/2021 1456   LEUKOCYTESUR Color Interference (A) 06/14/2021 1456   Sepsis Labs: Invalid input(s): PROCALCITONIN, LACTICIDVEN  Microbiology: Recent Results (from the past 240 hour(s))  Culture, body fluid w Gram Stain-bottle     Status: None (Preliminary result)   Collection Time: 07-16-21  4:04 PM   Specimen: Fluid  Result Value Ref Range Status   Specimen Description FLUID PERITONEAL ABDOMEN  Final   Special Requests BOTTLES DRAWN AEROBIC AND ANAEROBIC  Final   Culture   Final    NO GROWTH < 24 HOURS Performed at [facility], [address] [place], [place] [address]    Report Status PENDING  Incomplete  Gram stain     Status: None   Collection Time: 2021/07/16  4:04 PM   Specimen: Fluid  Result Value Ref Range Status   Specimen Description FLUID PERITONEAL ABDOMEN  Final   Special Requests NONE  Final   Gram Stain   Final    NO ORGANISMS SEEN SQUAMOUS EPITHELIAL CELLS PRESENT WBC PRESENT, PREDOMINANTLY MONONUCLEAR GRAM POSITIVE COCCI CYTOSPIN SMEAR Performed at [facility], [address] [place], [place] [address]    Report Status 2021/07/16 FINAL  Final  Resp Panel by RT-PCR (Flu A&B, Covid) Nasopharyngeal Swab     Status: None   Collection Time: 16-Jul-2021  4:11 PM   Specimen: Nasopharyngeal Swab; Nasopharyngeal(NP) swabs in vial  transport medium  Result Value Ref Range Status   SARS Coronavirus 2 by RT PCR NEGATIVE NEGATIVE Final    Comment: (NOTE) SARS-CoV-2 target nucleic acids are NOT DETECTED.  The SARS-CoV-2 RNA is generally detectable in upper respiratory specimens during the acute phase  of infection. The lowest concentration of SARS-CoV-2 viral copies this assay can detect is 138 copies/mL. A negative result does not preclude SARS-Cov-2 infection and should not be used as the sole basis for treatment or other patient management decisions. A negative result may occur with  improper specimen collection/handling, submission of specimen other than nasopharyngeal swab, presence of viral mutation(s) within the areas targeted by this assay, and inadequate number of viral copies(<138 copies/mL). A negative result must be combined with clinical observations, patient history, and epidemiological information. The expected result is Negative.  Fact Sheet for Patients:  BloggerCourse.com  Fact Sheet for Healthcare Providers:  SeriousBroker.it  This test is no t yet approved or cleared by the [place] FDA and  has been authorized for detection and/or diagnosis of SARS-CoV-2 by FDA under an Emergency Use Authorization (EUA). This EUA will remain  in effect (meaning this test can be used) for the duration of the COVID-19 declaration under Section 564(b)(1) of the Act, 21 U.S.C.section 360bbb-3(b)(1), unless the authorization is terminated  or revoked sooner.       Influenza A by PCR NEGATIVE NEGATIVE Final   Influenza B by PCR NEGATIVE NEGATIVE Final    Comment: (NOTE) The Xpert Xpress SARS-CoV-2/FLU/RSV plus assay is intended as an aid in the diagnosis of influenza from Nasopharyngeal swab specimens and should not be used as a sole basis for treatment. Nasal washings and aspirates are unacceptable for Xpert Xpress SARS-CoV-2/FLU/RSV testing.  Fact  Sheet for Patients: BloggerCourse.com  Fact Sheet for Healthcare Providers: SeriousBroker.it  This test is not yet approved or cleared by the [place] FDA and has been authorized for detection and/or diagnosis of SARS-CoV-2 by FDA under an Emergency Use Authorization (EUA). This EUA will remain in effect (meaning this test can be used) for the duration of the COVID-19 declaration under Section 564(b)(1) of the Act, 21 U.S.C. section 360bbb-3(b)(1), unless the authorization is terminated or revoked.  Performed at [facility], [address] [place], [place] [address]     Radiology Studies: US RENAL  Result Date: 06/11/2021 CLINICAL DATA:  Initial evaluation for acute kidney failure. EXAM: RENAL / URINARY TRACT ULTRASOUND COMPLETE COMPARISON:  Prior CT from 06/15/2021. FINDINGS: Right Kidney: Renal measurements: 11.2 x 5.0 x 5.5 cm = volume: 116.7 mL. Renal echogenicity within normal limits. No nephrolithiasis or hydronephrosis. No focal renal mass. Left Kidney: Renal measurements: 11.3 x 5.2 x 5.2 cm = volume: 159.3 mL. Renal echogenicity within normal limits. No nephrolithiasis or hydronephrosis. No focal renal mass. Bladder: Appears normal for degree of bladder distention. Other: Trace ascites noted adjacent to the liver within the right upper quadrant. Probable small right pleural effusion noted as well. No new hepatic cirrhosis partially visualized. IMPRESSION: 1. Normal renal ultrasound. No hydronephrosis or other acute abnormality. 2. Trace perihepatic ascites and small right pleural effusion. 3. Hepatic cirrhosis, partially visualized. Electronically Signed   By: Rise Mu M.D.   On: 06/16/2021 20:44   ECHOCARDIOGRAM COMPLETE  Result Date: 07/05/2021    ECHOCARDIOGRAM REPORT   Patient Name:   ISBELLA ARLINE Date of Exam: 07/05/2021 Medical Rec #:  [id number]     Height:       63.0 in Accession #:    [id number]     Weight:       230.2 lb Date of Birth:  May 07, 1971      BSA:          2.053 m Patient Age:    44  years      BP:           98/56 mmHg Patient Gender: F             HR:           110 bpm. Exam Location:  Inpatient Procedure: 2D Echo, Cardiac Doppler, Color Doppler and Intracardiac            Opacification Agent Indications:    ETOH abuse  History:        Patient has no prior history of Echocardiogram examinations.  Sonographer:    Cleatis Polka Referring Phys: [id number] JENNIFER LYNNE LEMMON IMPRESSIONS  1. Left ventricular ejection fraction, by estimation, is 65 to 70%. The left ventricle has hyperdynamic function. The left ventricle has no regional wall motion abnormalities. Left ventricular diastolic parameters are consistent with Grade I diastolic dysfunction (impaired relaxation).  2. Right ventricular systolic function is normal. The right ventricular size is normal. Tricuspid regurgitation signal is inadequate for assessing PA pressure.  3. The mitral valve is normal in structure. Trivial mitral valve regurgitation. No evidence of mitral stenosis.  4. The aortic valve is tricuspid. Aortic valve regurgitation is not visualized. Aortic valve mean gradient measures 14.0 mmHg. Elevated gradient across the aortic valve but the valve opens normally visually. Suspect elevated gradient is due to hyperdynamic LV function. There also does not appear to be LV outflow obstruction.  5. The inferior vena cava is normal in size with greater than 50% respiratory variability, suggesting right atrial pressure of 3 mmHg. FINDINGS  Left Ventricle: Left ventricular ejection fraction, by estimation, is 65 to 70%. The left ventricle has hyperdynamic function. The left ventricle has no regional wall motion abnormalities. The left ventricular internal cavity size was normal in size. There is no left ventricular hypertrophy. Left ventricular diastolic parameters are consistent with Grade I diastolic dysfunction (impaired relaxation). Right  Ventricle: The right ventricular size is normal. No increase in right ventricular wall thickness. Right ventricular systolic function is normal. Tricuspid regurgitation signal is inadequate for assessing PA pressure. Left Atrium: Left atrial size was normal in size. Right Atrium: Right atrial size was normal in size. Pericardium: There is no evidence of pericardial effusion. Mitral Valve: The mitral valve is normal in structure. Trivial mitral valve regurgitation. No evidence of mitral valve stenosis. Tricuspid Valve: The tricuspid valve is normal in structure. Tricuspid valve regurgitation is not demonstrated. Aortic Valve: The aortic valve is tricuspid. Aortic valve regurgitation is not visualized. Aortic valve mean gradient measures 14.0 mmHg. Aortic valve peak gradient measures 25.1 mmHg. Aortic valve area, by VTI measures 2.56 cm. Pulmonic Valve: The pulmonic valve was normal in structure. Pulmonic valve regurgitation is not visualized. Aorta: The aortic root is normal in size and structure. Venous: The inferior vena cava is normal in size with greater than 50% respiratory variability, suggesting right atrial pressure of 3 mmHg. IAS/Shunts: No atrial level shunt detected by color flow Doppler.  LEFT VENTRICLE PLAX 2D LVIDd:         5.20 cm     Diastology LVIDs:         2.50 cm     LV e' medial:    13.90 cm/s LV PW:         1.00 cm     LV E/e' medial:  6.6 LV IVS:        1.00 cm     LV e' lateral:   11.70 cm/s LVOT diam:     2.10  cm     LV E/e' lateral: 7.8 LV SV:         101 LV SV Index:   49 LVOT Area:     3.46 cm  LV Volumes (MOD) LV vol d, MOD A2C: 98.4 ml LV vol d, MOD A4C: 83.5 ml LV vol s, MOD A2C: 32.4 ml LV vol s, MOD A4C: 26.4 ml LV SV MOD A2C:     66.0 ml LV SV MOD A4C:     83.5 ml LV SV MOD BP:      62.1 ml RIGHT VENTRICLE RV Basal diam:  3.10 cm RV S prime:     21.70 cm/s TAPSE (M-mode): 3.1 cm LEFT ATRIUM             Index        RIGHT ATRIUM           Index LA diam:        3.80 cm 1.85 cm/m    RA Area:     16.70 cm LA Vol (A2C):   43.4 ml 21.14 ml/m  RA Volume:   38.30 ml  18.66 ml/m LA Vol (A4C):   54.8 ml 26.70 ml/m LA Biplane Vol: 51.7 ml 25.19 ml/m  AORTIC VALVE AV Area (Vmax):    2.34 cm AV Area (Vmean):   2.43 cm AV Area (VTI):     2.56 cm AV Vmax:           250.50 cm/s AV Vmean:          181.000 cm/s AV VTI:            0.396 m AV Peak Grad:      25.1 mmHg AV Mean Grad:      14.0 mmHg LVOT Vmax:         169.50 cm/s LVOT Vmean:        127.000 cm/s LVOT VTI:          0.292 m LVOT/AV VTI ratio: 0.74  AORTA Ao Root diam: 3.00 cm Ao Asc diam:  3.00 cm MITRAL VALVE MV Area (PHT): 6.48 cm     SHUNTS MV Decel Time: 117 msec     Systemic VTI:  0.29 m MV E velocity: 91.30 cm/s   Systemic Diam: 2.10 cm MV A velocity: 107.00 cm/s MV E/A ratio:  0.85 Dalton McleanMD Electronically signed by Wilfred Lacy Signature Date/Time: 07/05/2021/3:13:09 PM    Final       Saraia Platner T. Dayyan Krist [facility]  If 7PM-7AM, please contact night-coverage [email or web address] 07/05/2021, 5:34 PM

## 2021-07-05 NOTE — Progress Notes (Signed)
Subjective:  BP soft , no UOP recorded, has purewick and dont think has put any out- sodium stable at 117-  AKI worse-  renal u/s unremark-  she is confused-  husband at bedside  Objective Vital signs in last 24 hours: Vitals:   07/05/21 0456 07/05/21 0700 07/05/21 0800 07/05/21 0810  BP: (!) 124/58 (!) 102/53 (!) 98/56 (!) 98/56  Pulse: (!) 114 (!) 104 (!) 110 (!) 110  Resp: 20 (!) 24 20 20   Temp: 98.5 F (36.9 C)   98 F (36.7 C)  TempSrc: Oral     SpO2: 93% 92% 93%   Weight:      Height:       Weight change:   Intake/Output Summary (Last 24 hours) at 07/05/2021 07/07/2021 Last data filed at 07/05/2021 0810 Gross per 24 hour  Intake 0 ml  Output --  Net 0 ml    Assessment/ Plan: Pt is a 50 y.o. yo female with cirrhosis who was admitted on 07/05/2021 with edema, AKI and hyponatremia Assessment/Plan: 1. Renal-  clinical scenario c/w HRS.  No urine avail for interpretation.  Will attempt treatment with midodrine/octreotide and IV albumin.  Dialysis is not usually used in this situation as prognosis is poor and mortality is high regardless.  Lasix is ordered but no UOP yet 2. Cirrhosis -  GI on board, thinking ETOH hepatitis- according to husband has been a down hill trajectory for the the last 7-8 weeks so there is some chronicity here.   started her on prednisone 40 daily.  Not sure if use MELD score in this scenario but hers predicts a 53% mortality in 3 mos 3. Anemia-  hgb dropping-  inr up-- supportive care  4. HTN/volume-  overloaded and third spacing due to albumin level 5. Hyponatremia-  due to volume overload-  started on lasix but no impact yet 6.  Very poor prognosis it seems - I have tried to relay that to the husband as gently as I can.  If no improvement in the short term consider palliative care   07/06/2021    Labs: Basic Metabolic Panel: Recent Labs  Lab 07/10/2021 1813 07/05/21 0201 07/05/21 0731  NA 117* 117* 117*  K 4.2 4.4 4.6  CL 74* 75* 75*   CO2 19* 20* 23  GLUCOSE 74 73 84  BUN 33* 36* 41*  CREATININE 1.64* 1.94* 2.22*  CALCIUM 8.3* 8.4* 8.0*   Liver Function Tests: Recent Labs  Lab 06/30/2021 1310 07/05/21 0201  AST 232* 209*  ALT 59* 58*  ALKPHOS 157* 148*  BILITOT 21.7* 22.9*  PROT 7.7 7.2  ALBUMIN 2.4* 2.2*   Recent Labs  Lab 06/27/2021 1310  LIPASE 407*   Recent Labs  Lab 07/01/2021 1813  AMMONIA 75*   CBC: Recent Labs  Lab 06/10/2021 1310 06/30/2021 2205 07/05/21 0201 07/05/21 0731  WBC 15.6*  --  16.0*  --   NEUTROABS 12.5*  --   --   --   HGB 8.3* 7.8* 7.4* 7.1*  HCT 23.3* 21.9* 21.2* 20.3*  MCV 106.9*  --  107.6*  --   PLT 172  --  156  --    Cardiac Enzymes: No results for input(s): CKTOTAL, CKMB, CKMBINDEX, TROPONINI in the last 168 hours. CBG: No results for input(s): GLUCAP in the last 168 hours.  Iron Studies: No results for input(s): IRON, TIBC, TRANSFERRIN, FERRITIN in the last 72 hours. Studies/Results: DG Chest 2 View  Result Date: 06/10/2021 CLINICAL DATA:  Shortness of breath EXAM: CHEST - 2 VIEW COMPARISON:  07/20/2016 FINDINGS: Borderline heart size, likely accentuated by AP semi upright technique and low lung volumes. Mild pulmonary vascular congestion. Small focal opacity at the right lung base. No pleural effusion or pneumothorax. IMPRESSION: 1. Small focal opacity at the right lung base may reflect atelectasis or pneumonia. Recommend repeat inspiratory PA and lateral radiographs of the chest for further evaluation. 2. Mild pulmonary vascular congestion. Electronically Signed   By: Duanne Guess D.O.   On: 06/26/2021 16:50   US RENAL  Result Date: 07/07/2021 CLINICAL DATA:  Initial evaluation for acute kidney failure. EXAM: RENAL / URINARY TRACT ULTRASOUND COMPLETE COMPARISON:  Prior CT from 06/15/2021. FINDINGS: Right Kidney: Renal measurements: 11.2 x 5.0 x 5.5 cm = volume: 116.7 mL. Renal echogenicity within normal limits. No nephrolithiasis or hydronephrosis. No focal  renal mass. Left Kidney: Renal measurements: 11.3 x 5.2 x 5.2 cm = volume: 159.3 mL. Renal echogenicity within normal limits. No nephrolithiasis or hydronephrosis. No focal renal mass. Bladder: Appears normal for degree of bladder distention. Other: Trace ascites noted adjacent to the liver within the right upper quadrant. Probable small right pleural effusion noted as well. No new hepatic cirrhosis partially visualized. IMPRESSION: 1. Normal renal ultrasound. No hydronephrosis or other acute abnormality. 2. Trace perihepatic ascites and small right pleural effusion. 3. Hepatic cirrhosis, partially visualized. Electronically Signed   By: Rise Mu M.D.   On: 06/21/2021 20:44   US Abdomen Limited RUQ (LIVER/GB)  Result Date: 06/14/2021 CLINICAL DATA:  Jaundice and cirrhosis. EXAM: ULTRASOUND ABDOMEN LIMITED RIGHT UPPER QUADRANT COMPARISON:  None. FINDINGS: Gallbladder: No gallstones or wall thickening visualized. No sonographic Murphy sign noted by sonographer. Common bile duct: Diameter: Not well seen.  Approximately 3-4 mm diameter. Liver: Markedly echogenic and heterogeneous liver parenchyma with nodular contour. Portal vein is patent on color Doppler imaging with normal direction of blood flow towards the liver. Other: None. IMPRESSION: Nodular, echogenic liver parenchyma compatible with cirrhosis. No focal abnormality evident although assessment limited due to poor acoustic through transmission. No biliary dilatation Electronically Signed   By: Kennith Center M.D.   On: 07/10/2021 15:02   IR Paracentesis  Result Date: 07/09/2021 INDICATION: Patient with a history of alcohol abuse and cirrhosis presents today with ascites. Interventional Radiology asked to perform a diagnostic and therapeutic paracentesis EXAM: ULTRASOUND GUIDED PARACENTESIS MEDICATIONS: 1% lidocaine 15 mL COMPLICATIONS: None immediate. PROCEDURE: Informed written consent was obtained from the patient after a discussion of the  risks, benefits and alternatives to treatment. A timeout was performed prior to the initiation of the procedure. Initial ultrasound scanning demonstrates a large amount of ascites within the left lower abdominal quadrant. The left lower abdomen was prepped and draped in the usual sterile fashion. 1% lidocaine was used for local anesthesia. Following this, a 19 gauge, 15-cm, Yueh catheter was introduced. An ultrasound image was saved for documentation purposes. The paracentesis was performed. The catheter was removed and a dressing was applied. The patient tolerated the procedure well without immediate post procedural complication. FINDINGS: A total of approximately 800 mL of orange yellow fluid was removed. Samples were sent to the laboratory as requested by the clinical team. IMPRESSION: Successful ultrasound-guided paracentesis yielding 800 mL of peritoneal fluid. Read by: Alwyn Ren, NP Electronically Signed   By: Marliss Coots M.D.   On: 07/03/2021 16:19   Medications: Infusions:  cefTRIAXone (ROCEPHIN)  IV 2 g (07/05/21 0810)   sodium chloride  Scheduled Medications:  folic acid  1 mg Oral Daily   furosemide  80 mg Intravenous Q12H   multivitamin with minerals  1 tablet Oral Daily   pantoprazole (PROTONIX) IV  40 mg Intravenous Q12H   prednisoLONE  40 mg Oral Daily   thiamine  100 mg Oral Daily   Or   thiamine  100 mg Intravenous Daily    have reviewed scheduled and prn medications.  Physical Exam: General: obese, jaundiced, confused-  getting echo Heart: tachy Lungs: dec BS at bases Abdomen: obese, ascites-  purewick  in place Extremities: pitting edema     07/05/2021,9:37 AM  LOS: 1 day

## 2021-07-05 NOTE — Progress Notes (Signed)
Initial Nutrition Assessment  DOCUMENTATION CODES:  Morbid obesity  INTERVENTION:  Add Nepro Shake po TID, each supplement provides 425 kcal and 19 grams protein.  Add Rena-Vite daily.  Continue CIWA protocol.  Encourage PO and supplement intake.  NUTRITION DIAGNOSIS:  Inadequate oral intake related to lethargy/confusion as evidenced by per patient/family report.  GOAL:  Patient will meet greater than or equal to 90% of their needs  MONITOR:  PO intake, Labs, Supplement acceptance, Weight trends, Skin, I & O's  REASON FOR ASSESSMENT:  Consult Assessment of nutrition requirement/status  ASSESSMENT:  50 yo female with a PMH of chronic alcohol abuse, IBS, migraines with a very long history of heavy alcohol use who is admitted with renal failure and EtOH abuse. 1025 - paracentesis (yielded 800 ml of orange/yellow fluid)   Spoke with pt's husband, as pt was very tired and resting during RD visit. He reports that she has not eaten very much since Sunday, which was her last full meal.  He reports that she vomited on Monday evening, only bile.  Per Epic, pt ate 0% of her breakfast this morning.  Per Epic, pt's weight trending up, likely from fluid accumulation with cirrhosis. Of note, pt with moderate generalized, mild BUE, and moderate BLE edema.  Pt's husband wishes to trial Nepro with pt, as she does not like Ensure. RD to add Rena-Vite daily, as well as recommend continuing CIWA protocol.  Medications: reviewed; folic acid, Lasix BID, MVI with minerals, Protonix BID, prednisolone, thiamine, Ativan PRN (given once today)  Labs: reviewed; Na 117 (L), BUN 41 (H - trending up), Crt 2.22 (H - trending up)  NUTRITION - FOCUSED PHYSICAL EXAM: Flowsheet Row Most Recent Value  Orbital Region Mild depletion  Upper Arm Region No depletion  Thoracic and Lumbar Region No depletion  [Gross ascites]  Buccal Region No depletion  Temple Region Mild depletion  Clavicle Bone Region No  depletion  Clavicle and Acromion Bone Region No depletion  Scapular Bone Region No depletion  Dorsal Hand No depletion  Patellar Region No depletion  Anterior Thigh Region No depletion  Posterior Calf Region No depletion  Edema (RD Assessment) Moderate  [moderate generalized, mild BUE, and moderate BLE]  Hair Reviewed  Eyes Reviewed  Mouth Unable to assess  Skin Reviewed  Nails Reviewed   Diet Order:   Diet Order             Diet renal with fluid restriction Fluid restriction: 1200 mL Fluid; Room service appropriate? Yes; Fluid consistency: Thin  Diet effective now                  EDUCATION NEEDS:  Education needs have been addressed  Skin:  Skin Assessment: Reviewed RN Assessment (Jaundice)  Last BM:  no BM documented  Height:  Ht Readings from Last 1 Encounters:  07/05/21 5\' 3"  (1.6 m)   Weight:  Wt Readings from Last 1 Encounters:  07/05/21 104.4 kg   BMI:  Body mass index is 40.77 kg/m.  Estimated Nutritional Needs:  Kcal:  2100-2300 Protein:  125-140 grams Fluid:  >2.1 L  07/07/21, RD, LDN (she/her/hers) Registered Dietitian I Pager #: [phone] After-Hours/Weekend Pager # in [place]

## 2021-07-06 DIAGNOSIS — R7989 Other specified abnormal findings of blood chemistry: Secondary | ICD-10-CM | POA: Diagnosis not present

## 2021-07-06 DIAGNOSIS — K7031 Alcoholic cirrhosis of liver with ascites: Secondary | ICD-10-CM | POA: Diagnosis not present

## 2021-07-06 DIAGNOSIS — K7011 Alcoholic hepatitis with ascites: Secondary | ICD-10-CM | POA: Diagnosis not present

## 2021-07-06 DIAGNOSIS — N179 Acute kidney failure, unspecified: Secondary | ICD-10-CM | POA: Diagnosis not present

## 2021-07-06 DIAGNOSIS — K7200 Acute and subacute hepatic failure without coma: Secondary | ICD-10-CM | POA: Diagnosis not present

## 2021-07-06 DIAGNOSIS — K7682 Hepatic encephalopathy: Secondary | ICD-10-CM | POA: Diagnosis not present

## 2021-07-06 DIAGNOSIS — K767 Hepatorenal syndrome: Secondary | ICD-10-CM

## 2021-07-06 DIAGNOSIS — Z7189 Other specified counseling: Secondary | ICD-10-CM

## 2021-07-06 LAB — CBC WITH DIFFERENTIAL/PLATELET
Abs Immature Granulocytes: 0.7 10*3/uL — ABNORMAL HIGH (ref 0.00–0.07)
Basophils Absolute: 0 10*3/uL (ref 0.0–0.1)
Basophils Relative: 0 %
Eosinophils Absolute: 0 10*3/uL (ref 0.0–0.5)
Eosinophils Relative: 0 %
HCT: 18.9 % — ABNORMAL LOW (ref 36.0–46.0)
Hemoglobin: 6.6 g/dL — CL (ref 12.0–15.0)
Immature Granulocytes: 5 %
Lymphocytes Relative: 7 %
Lymphs Abs: 1 10*3/uL (ref 0.7–4.0)
MCH: 38.4 pg — ABNORMAL HIGH (ref 26.0–34.0)
MCHC: 34.9 g/dL (ref 30.0–36.0)
MCV: 109.9 fL — ABNORMAL HIGH (ref 80.0–100.0)
Monocytes Absolute: 1 10*3/uL (ref 0.1–1.0)
Monocytes Relative: 6 %
Neutro Abs: 12.4 10*3/uL — ABNORMAL HIGH (ref 1.7–7.7)
Neutrophils Relative %: 82 %
Platelets: 141 10*3/uL — ABNORMAL LOW (ref 150–400)
RBC: 1.72 MIL/uL — ABNORMAL LOW (ref 3.87–5.11)
RDW: 18 % — ABNORMAL HIGH (ref 11.5–15.5)
WBC: 15 10*3/uL — ABNORMAL HIGH (ref 4.0–10.5)
nRBC: 0.1 % (ref 0.0–0.2)

## 2021-07-06 LAB — ACID FAST SMEAR (AFB, MYCOBACTERIA): Acid Fast Smear: NEGATIVE

## 2021-07-06 LAB — LIPASE, BLOOD: Lipase: 205 U/L — ABNORMAL HIGH (ref 11–51)

## 2021-07-06 LAB — COMPREHENSIVE METABOLIC PANEL
ALT: 53 U/L — ABNORMAL HIGH (ref 0–44)
AST: 187 U/L — ABNORMAL HIGH (ref 15–41)
Albumin: 2.7 g/dL — ABNORMAL LOW (ref 3.5–5.0)
Alkaline Phosphatase: 114 U/L (ref 38–126)
Anion gap: 19 — ABNORMAL HIGH (ref 5–15)
BUN: 54 mg/dL — ABNORMAL HIGH (ref 6–20)
CO2: 23 mmol/L (ref 22–32)
Calcium: 8.1 mg/dL — ABNORMAL LOW (ref 8.9–10.3)
Chloride: 74 mmol/L — ABNORMAL LOW (ref 98–111)
Creatinine, Ser: 2.46 mg/dL — ABNORMAL HIGH (ref 0.44–1.00)
GFR, Estimated: 23 mL/min — ABNORMAL LOW (ref >60)
Glucose, Bld: 160 mg/dL — ABNORMAL HIGH (ref 70–99)
Potassium: 4.4 mmol/L (ref 3.5–5.1)
Sodium: 116 mmol/L — CL (ref 135–145)
Total Bilirubin: 23.7 mg/dL (ref 0.3–1.2)
Total Protein: 7.3 g/dL (ref 6.5–8.1)

## 2021-07-06 LAB — ABO/RH: ABO/RH(D): O POS

## 2021-07-06 LAB — HCV AB W REFLEX TO QUANT PCR: HCV Ab: <0.1 s/co ratio (ref 0.0–0.9)

## 2021-07-06 LAB — APTT: aPTT: 35 seconds (ref 24–36)

## 2021-07-06 LAB — AMMONIA: Ammonia: 57 umol/L — ABNORMAL HIGH (ref 9–35)

## 2021-07-06 LAB — ALPHA-1-ANTITRYPSIN: A-1 Antitrypsin, Ser: 231 mg/dL — ABNORMAL HIGH (ref 101–187)

## 2021-07-06 LAB — MITOCHONDRIAL ANTIBODIES: Mitochondrial M2 Ab, IgG: <20 Units (ref 0.0–20.0)

## 2021-07-06 LAB — HEPATITIS B SURFACE ANTIBODY, QUANTITATIVE: Hep B S AB Quant (Post): 67.9 m[IU]/mL (ref >9.9)

## 2021-07-06 LAB — PREPARE RBC (CROSSMATCH)

## 2021-07-06 LAB — ANTI-SMOOTH MUSCLE ANTIBODY, IGG: F-Actin IgG: 8 Units (ref 0–19)

## 2021-07-06 LAB — PHOSPHORUS: Phosphorus: 5.8 mg/dL — ABNORMAL HIGH (ref 2.5–4.6)

## 2021-07-06 LAB — PROTIME-INR
INR: 1.4 — ABNORMAL HIGH (ref 0.8–1.2)
Prothrombin Time: 17.5 seconds — ABNORMAL HIGH (ref 11.4–15.2)

## 2021-07-06 LAB — CK: Total CK: 35 U/L — ABNORMAL LOW (ref 38–234)

## 2021-07-06 LAB — CERULOPLASMIN: Ceruloplasmin: 22.3 mg/dL (ref 19.0–39.0)

## 2021-07-06 LAB — MAGNESIUM: Magnesium: 2.9 mg/dL — ABNORMAL HIGH (ref 1.7–2.4)

## 2021-07-06 LAB — HCV INTERPRETATION

## 2021-07-06 MED ORDER — GLYCOPYRROLATE 0.2 MG/ML IJ SOLN
0.2000 mg | INTRAMUSCULAR | Status: DC | PRN
  Administered 2021-07-07 – 2021-07-08 (×5): 0.2 mg via INTRAVENOUS
  Filled 2021-07-06 (×5): qty 1

## 2021-07-06 MED ORDER — HYDROMORPHONE HCL 1 MG/ML IJ SOLN: 0.5000 mg | INTRAMUSCULAR | Status: DC | PRN

## 2021-07-06 MED ORDER — ONDANSETRON 4 MG PO TBDP
4.0000 mg | ORAL_TABLET | Freq: Four times a day (QID) | ORAL | Status: DC | PRN
  Filled 2021-07-06: qty 1

## 2021-07-06 MED ORDER — LORAZEPAM 2 MG/ML PO CONC: 1.0000 mg | ORAL | Status: DC | PRN

## 2021-07-06 MED ORDER — GLYCOPYRROLATE 1 MG PO TABS
1.0000 mg | ORAL_TABLET | ORAL | Status: DC | PRN
  Filled 2021-07-06: qty 1

## 2021-07-06 MED ORDER — BIOTENE DRY MOUTH MT LIQD: 15.0000 mL | OROMUCOSAL | Status: DC | PRN

## 2021-07-06 MED ORDER — ONDANSETRON HCL 4 MG/2ML IJ SOLN: 4.0000 mg | Freq: Four times a day (QID) | INTRAMUSCULAR | Status: DC | PRN

## 2021-07-06 MED ORDER — POLYVINYL ALCOHOL 1.4 % OP SOLN
1.0000 [drp] | Freq: Four times a day (QID) | OPHTHALMIC | Status: DC | PRN
  Filled 2021-07-06: qty 15

## 2021-07-06 MED ORDER — HALOPERIDOL LACTATE 2 MG/ML PO CONC
0.5000 mg | ORAL | Status: DC | PRN
  Filled 2021-07-06: qty 0.3

## 2021-07-06 MED ORDER — SODIUM CHLORIDE 0.9% IV SOLUTION: Freq: Once | INTRAVENOUS | Status: DC

## 2021-07-06 MED ORDER — HYDROMORPHONE HCL 1 MG/ML IJ SOLN
1.0000 mg | INTRAMUSCULAR | Status: DC | PRN
  Administered 2021-07-06 – 2021-07-07 (×9): 1 mg via INTRAVENOUS
  Filled 2021-07-06 (×9): qty 1

## 2021-07-06 MED ORDER — LORAZEPAM 2 MG/ML IJ SOLN
1.0000 mg | INTRAMUSCULAR | Status: DC | PRN
  Administered 2021-07-07 – 2021-07-08 (×2): 1 mg via INTRAVENOUS
  Filled 2021-07-06 (×2): qty 1

## 2021-07-06 MED ORDER — LORAZEPAM 1 MG PO TABS: 1.0000 mg | ORAL_TABLET | ORAL | Status: DC | PRN

## 2021-07-06 MED ORDER — DIPHENHYDRAMINE HCL 50 MG/ML IJ SOLN: 12.5000 mg | INTRAMUSCULAR | Status: DC | PRN

## 2021-07-06 MED ORDER — SODIUM CHLORIDE 0.9 % IV SOLN
12.5000 mg | Freq: Four times a day (QID) | INTRAVENOUS | Status: DC | PRN
  Filled 2021-07-06: qty 0.5

## 2021-07-06 MED ORDER — HALOPERIDOL LACTATE 5 MG/ML IJ SOLN: 0.5000 mg | INTRAMUSCULAR | Status: DC | PRN

## 2021-07-06 MED ORDER — HALOPERIDOL 0.5 MG PO TABS
0.5000 mg | ORAL_TABLET | ORAL | Status: DC | PRN
  Filled 2021-07-06: qty 1

## 2021-07-06 MED ORDER — GLYCOPYRROLATE 0.2 MG/ML IJ SOLN: 0.2000 mg | INTRAMUSCULAR | Status: DC | PRN

## 2021-07-06 NOTE — Progress Notes (Signed)
GASTROENTEROLOGY ROUNDING NOTE   Subjective: No improvement in renal function or hyponatremia.  Anuric despite albumin, high dose lasix, midodrin/octreotide.  Mental status continues to wax and wane.  Hgb drifting down.      Objective: Vital signs in last 24 hours: Temp:  [97.7 F (36.5 C)-98.7 F (37.1 C)] 97.7 F (36.5 C) (10/27 1642) Pulse Rate:  [96-100] 100 (10/27 1642) Resp:  [19-22] 22 (10/27 1642) BP: (116-125)/(70-75) 125/70 (10/27 1642) SpO2:  [92 %-93 %] 93 % (10/27 0353) Weight:  [105.5 kg] 105.5 kg (10/27 0000)   General: Ill, appearing deeply jaundiced female, intermittently interactive, voiced preference for hospice.  Husband, brother and sister at bedside. Abdomen:  Distended    Intake/Output from previous day: 10/26 0701 - 10/27 0700 In: 449.9 [P.O.:120; IV Piggyback:329.9] Out: 0  Intake/Output this shift: No intake/output data recorded.   Lab Results: Recent Labs    07/24/2021 1310 2021-07-24 2205 07/05/21 0201 07/05/21 0731 07/05/21 1556 07/06/21 0324  WBC 15.6*  --  16.0*  --   --  15.0*  HGB 8.3*   < > 7.4* 7.1* 7.2* 6.6*  PLT 172  --  156  --   --  141*  MCV 106.9*  --  107.6*  --   --  109.9*   < > = values in this interval not displayed.   BMET Recent Labs    07/05/21 1156 07/05/21 1556 07/06/21 0324  NA 117* 118* 116*  K 4.7 5.0 4.4  CL 76* 74* 74*  CO2 23 23 23   GLUCOSE 88 98 160*  BUN 43* 47* 54*  CREATININE 2.37* 2.41* 2.46*  CALCIUM 8.1* 8.4* 8.1*   LFT Recent Labs    07-24-21 1310 07/05/21 0201 07/06/21 0324  PROT 7.7 7.2 7.3  ALBUMIN 2.4* 2.2* 2.7*  AST 232* 209* 187*  ALT 59* 58* 53*  ALKPHOS 157* 148* 114  BILITOT 21.7* 22.9* 23.7*   PT/INR Recent Labs    07/05/21 0201 07/06/21 0324  INR 1.4* 1.4*      Imaging/Other results: ECHOCARDIOGRAM COMPLETE  Result Date: 07/05/2021    ECHOCARDIOGRAM REPORT   Patient Name:   Kristine Sims Date of Exam: 07/05/2021 Medical Rec #:  07/07/2021     Height:        63.0 in Accession #:    [id number]    Weight:       230.2 lb Date of Birth:  03-03-1971      BSA:          2.053 m Patient Age:    50 years      BP:           98/56 mmHg Patient Gender: F             HR:           110 bpm. Exam Location:  Inpatient Procedure: 2D Echo, Cardiac Doppler, Color Doppler and Intracardiac            Opacification Agent Indications:    ETOH abuse  History:        Patient has no prior history of Echocardiogram examinations.  Sonographer:    03/11/1971 Referring Phys: Cleatis Polka JENNIFER LYNNE LEMMON IMPRESSIONS  1. Left ventricular ejection fraction, by estimation, is 65 to 70%. The left ventricle has hyperdynamic function. The left ventricle has no regional wall motion abnormalities. Left ventricular diastolic parameters are consistent with Grade I diastolic dysfunction (impaired relaxation).  2. Right ventricular systolic function is normal.  The right ventricular size is normal. Tricuspid regurgitation signal is inadequate for assessing PA pressure.  3. The mitral valve is normal in structure. Trivial mitral valve regurgitation. No evidence of mitral stenosis.  4. The aortic valve is tricuspid. Aortic valve regurgitation is not visualized. Aortic valve mean gradient measures 14.0 mmHg. Elevated gradient across the aortic valve but the valve opens normally visually. Suspect elevated gradient is due to hyperdynamic LV function. There also does not appear to be LV outflow obstruction.  5. The inferior vena cava is normal in size with greater than 50% respiratory variability, suggesting right atrial pressure of 3 mmHg. FINDINGS  Left Ventricle: Left ventricular ejection fraction, by estimation, is 65 to 70%. The left ventricle has hyperdynamic function. The left ventricle has no regional wall motion abnormalities. The left ventricular internal cavity size was normal in size. There is no left ventricular hypertrophy. Left ventricular diastolic parameters are consistent with Grade I  diastolic dysfunction (impaired relaxation). Right Ventricle: The right ventricular size is normal. No increase in right ventricular wall thickness. Right ventricular systolic function is normal. Tricuspid regurgitation signal is inadequate for assessing PA pressure. Left Atrium: Left atrial size was normal in size. Right [facility]: Right atrial size was normal in size. Pericardium: There is no evidence of pericardial effusion. Mitral Valve: The mitral valve is normal in structure. Trivial mitral valve regurgitation. No evidence of mitral valve stenosis. Tricuspid Valve: The tricuspid valve is normal in structure. Tricuspid valve regurgitation is not demonstrated. Aortic Valve: The aortic valve is tricuspid. Aortic valve regurgitation is not visualized. Aortic valve mean gradient measures 14.0 mmHg. Aortic valve peak gradient measures 25.1 mmHg. Aortic valve area, by VTI measures 2.56 cm. Pulmonic Valve: The pulmonic valve was normal in structure. Pulmonic valve regurgitation is not visualized. Aorta: The aortic root is normal in size and structure. Venous: The inferior vena cava is normal in size with greater than 50% respiratory variability, suggesting right atrial pressure of 3 mmHg. IAS/Shunts: No atrial level shunt detected by color flow Doppler.  LEFT VENTRICLE PLAX 2D LVIDd:         5.20 cm     Diastology LVIDs:         2.50 cm     LV e' medial:    13.90 cm/s LV PW:         1.00 cm     LV E/e' medial:  6.6 LV IVS:        1.00 cm     LV e' lateral:   11.70 cm/s LVOT diam:     2.10 cm     LV E/e' lateral: 7.8 LV SV:         101 LV SV Index:   49 LVOT Area:     3.46 cm  LV Volumes (MOD) LV vol d, MOD A2C: 98.4 ml LV vol d, MOD A4C: 83.5 ml LV vol s, MOD A2C: 32.4 ml LV vol s, MOD A4C: 26.4 ml LV SV MOD A2C:     66.0 ml LV SV MOD A4C:     83.5 ml LV SV MOD BP:      62.1 ml RIGHT VENTRICLE RV Basal diam:  3.10 cm RV S prime:     21.70 cm/s TAPSE (M-mode): 3.1 cm LEFT ATRIUM             Index        RIGHT ATRIUM            Index LA diam:  3.80 cm 1.85 cm/m   RA Area:     16.70 cm LA Vol (A2C):   43.4 ml 21.14 ml/m  RA Volume:   38.30 ml  18.66 ml/m LA Vol (A4C):   54.8 ml 26.70 ml/m LA Biplane Vol: 51.7 ml 25.19 ml/m  AORTIC VALVE AV Area (Vmax):    2.34 cm AV Area (Vmean):   2.43 cm AV Area (VTI):     2.56 cm AV Vmax:           250.50 cm/s AV Vmean:          181.000 cm/s AV VTI:            0.396 m AV Peak Grad:      25.1 mmHg AV Mean Grad:      14.0 mmHg LVOT Vmax:         169.50 cm/s LVOT Vmean:        127.000 cm/s LVOT VTI:          0.292 m LVOT/AV VTI ratio: 0.74  AORTA Ao Root diam: 3.00 cm Ao Asc diam:  3.00 cm MITRAL VALVE MV Area (PHT): 6.48 cm     SHUNTS MV Decel Time: 117 msec     Systemic VTI:  0.29 m MV E velocity: 91.30 cm/s   Systemic Diam: 2.10 cm MV A velocity: 107.00 cm/s MV E/A ratio:  0.85 Dalton McleanMD Electronically signed by Wilfred Lacy Signature Date/Time: 07/05/2021/3:13:09 PM    Final       Assessment and Plan:  50 year old female with longstanding heavy alcohol use admitted with jaundice, malaise, anorexia and abdominal discomfort and acute kidney injury with elevated aminotransferases (AST to ALT ratio 4) and significant hyperbilirubinemia (20s).  This history and presentation is very consistent with alcoholic hepatitis.  Her presentation is complicated by the presence of ascites (paracentesis performed, negative for SBP) and altered mental status which is likely multifactorial (encephalopathy, alcohol withdrawal, severe hyponatremia) and renal failure, consistent with hepatorenal syndrome  Over the past 24 hours, the patient has demonstrated no meaningful improvement and is now anuric despite high dose lasix, midodrine/octreotide and albumin. I spoke with the patient, her husband and siblings at length this morning and relayed that the prognosis was very grim.  I offered to contact [facility] to see if they would consider her for liver transplant, but the patient and  her family were all in agreement that they wanted to pursue hospice. I think this is the correct decision.   Defer to hospice team to institute comfort care measures. GI will sign off.      Jenel Lucks, MD  07/06/2021, 9:46 PM [facility]

## 2021-07-06 NOTE — Progress Notes (Signed)
Subjective:  BP soft but over 109 systolic, no UOP recorded, sodium stable at 116-  no better-  AKI worse-  she is confused-  husband at bedside-  also now joined by brother and sister-  decision has been made to pursue comfort care-  very appropriate  Objective Vital signs in last 24 hours: Vitals:   07/05/21 1800 07/05/21 2030 07/06/21 0000 07/06/21 0353  BP: (!) 133/57 (!) 109/59 119/70 116/75  Pulse: (!) 34 94 99 96  Resp: (!) 32 20 19 (!) 22  Temp: 97.9 F (36.6 C) 99.3 F (37.4 C) 98.2 F (36.8 C) 98.7 F (37.1 C)  TempSrc: Oral Oral Oral Oral  SpO2: 93% 93% 92% 93%  Weight:   105.5 kg   Height:       Weight change: 1.1 kg  Intake/Output Summary (Last 24 hours) at 07/06/2021 1023 Last data filed at 07/06/2021 0600 Gross per 24 hour  Intake 449.86 ml  Output 0 ml  Net 449.86 ml    Assessment/ Plan: Pt is a 50 y.o. yo female with cirrhosis who was admitted on 07-12-2021 with edema, AKI and hyponatremia Assessment/Plan: 1. Renal-  clinical scenario c/w HRS.  No urine avail for interpretation.  attempting treatment with midodrine/octreotide and IV albumin but no response yet.  Dialysis is not usually used in this situation as prognosis is poor and mortality is high regardless.  Lasix is ordered but no UOP - I will stop.  I think would be worthwhile to continue midodrine and octreotide at least for the the next 24 hours-  if it can be effective, can help in alleviating volume and making her more comfortable 2. Cirrhosis -  GI on board, thinking ETOH hepatitis- according to husband has been a down hill trajectory for the the last 7-8 weeks so there is some chronicity here.   started her on prednisone 40 daily.  Not sure if use MELD score in this scenario but hers predicts a 52-74% mortality in 3 mos 3. Anemia-  hgb dropping-  inr up-- supportive care  4. HTN/volume-  overloaded and third spacing due to albumin level 5. Hyponatremia-  due to volume overload-  started on lasix but  no impact yet 6.  Very poor prognosis it seems - family understanding of thei fact and going to pursue comfort care   Renal will sign off,  stop checking labs-  if no uop in 24 hours can stop the midodrine and octreotide    Cecille Aver    Labs: Basic Metabolic Panel: Recent Labs  Lab 07/05/21 1156 07/05/21 1556 07/06/21 0324  NA 117* 118* 116*  K 4.7 5.0 4.4  CL 76* 74* 74*  CO2 23 23 23   GLUCOSE 88 98 160*  BUN 43* 47* 54*  CREATININE 2.37* 2.41* 2.46*  CALCIUM 8.1* 8.4* 8.1*  PHOS  --   --  5.8*   Liver Function Tests: Recent Labs  Lab 2021/07/12 1310 07/05/21 0201 07/06/21 0324  AST 232* 209* 187*  ALT 59* 58* 53*  ALKPHOS 157* 148* 114  BILITOT 21.7* 22.9* 23.7*  PROT 7.7 7.2 7.3  ALBUMIN 2.4* 2.2* 2.7*   Recent Labs  Lab July 12, 2021 1310 07/06/21 0324  LIPASE 407* 205*   Recent Labs  Lab 07-12-21 1813 07/06/21 0324  AMMONIA 75* 57*   CBC: Recent Labs  Lab July 12, 2021 1310 07-12-21 2205 07/05/21 0201 07/05/21 0731 07/05/21 1556 07/06/21 0324  WBC 15.6*  --  16.0*  --   --  15.0*  NEUTROABS 12.5*  --   --   --   --  12.4*  HGB 8.3*   < > 7.4* 7.1* 7.2* 6.6*  HCT 23.3*   < > 21.2* 20.3* 19.9* 18.9*  MCV 106.9*  --  107.6*  --   --  109.9*  PLT 172  --  156  --   --  141*   < > = values in this interval not displayed.   Cardiac Enzymes: Recent Labs  Lab 07/06/21 0324  CKTOTAL 35*   CBG: No results for input(s): GLUCAP in the last 168 hours.  Iron Studies:  Recent Labs    07/05/21 1156  IRON 112  TIBC 112*   Studies/Results: DG Chest 2 View  Result Date: 07-10-21 CLINICAL DATA:  Shortness of breath EXAM: CHEST - 2 VIEW COMPARISON:  07/20/2016 FINDINGS: Borderline heart size, likely accentuated by AP semi upright technique and low lung volumes. Mild pulmonary vascular congestion. Small focal opacity at the right lung base. No pleural effusion or pneumothorax. IMPRESSION: 1. Small focal opacity at the right lung base may  reflect atelectasis or pneumonia. Recommend repeat inspiratory PA and lateral radiographs of the chest for further evaluation. 2. Mild pulmonary vascular congestion. Electronically Signed   By: Duanne Guess D.O.   On: 2021-07-10 16:50   US RENAL  Result Date: 10-Jul-2021 CLINICAL DATA:  Initial evaluation for acute kidney failure. EXAM: RENAL / URINARY TRACT ULTRASOUND COMPLETE COMPARISON:  Prior CT from 06/15/2021. FINDINGS: Right Kidney: Renal measurements: 11.2 x 5.0 x 5.5 cm = volume: 116.7 mL. Renal echogenicity within normal limits. No nephrolithiasis or hydronephrosis. No focal renal mass. Left Kidney: Renal measurements: 11.3 x 5.2 x 5.2 cm = volume: 159.3 mL. Renal echogenicity within normal limits. No nephrolithiasis or hydronephrosis. No focal renal mass. Bladder: Appears normal for degree of bladder distention. Other: Trace ascites noted adjacent to the liver within the right upper quadrant. Probable small right pleural effusion noted as well. No new hepatic cirrhosis partially visualized. IMPRESSION: 1. Normal renal ultrasound. No hydronephrosis or other acute abnormality. 2. Trace perihepatic ascites and small right pleural effusion. 3. Hepatic cirrhosis, partially visualized. Electronically Signed   By: Rise Mu M.D.   On: 2021/07/10 20:44   ECHOCARDIOGRAM COMPLETE  Result Date: 07/05/2021    ECHOCARDIOGRAM REPORT   Patient Name:   TEONA VARGUS Date of Exam: 07/05/2021 Medical Rec #:  [id number]     Height:       63.0 in Accession #:    [id number]    Weight:       230.2 lb Date of Birth:  [birth year]      BSA:          2.053 m Patient Age:    50 years      BP:           98/56 mmHg Patient Gender: F             HR:           110 bpm. Exam Location:  Inpatient Procedure: 2D Echo, Cardiac Doppler, Color Doppler and Intracardiac            Opacification Agent Indications:    ETOH abuse  History:        Patient has no prior history of Echocardiogram examinations.  Sonographer:     Cleatis Polka Referring Phys: [id number] JENNIFER LYNNE LEMMON IMPRESSIONS  1. Left ventricular ejection fraction, by estimation, is 65 to 70%. The left  ventricle has hyperdynamic function. The left ventricle has no regional wall motion abnormalities. Left ventricular diastolic parameters are consistent with Grade I diastolic dysfunction (impaired relaxation).  2. Right ventricular systolic function is normal. The right ventricular size is normal. Tricuspid regurgitation signal is inadequate for assessing PA pressure.  3. The mitral valve is normal in structure. Trivial mitral valve regurgitation. No evidence of mitral stenosis.  4. The aortic valve is tricuspid. Aortic valve regurgitation is not visualized. Aortic valve mean gradient measures 14.0 mmHg. Elevated gradient across the aortic valve but the valve opens normally visually. Suspect elevated gradient is due to hyperdynamic LV function. There also does not appear to be LV outflow obstruction.  5. The inferior vena cava is normal in size with greater than 50% respiratory variability, suggesting right atrial pressure of 3 mmHg. FINDINGS  Left Ventricle: Left ventricular ejection fraction, by estimation, is 65 to 70%. The left ventricle has hyperdynamic function. The left ventricle has no regional wall motion abnormalities. The left ventricular internal cavity size was normal in size. There is no left ventricular hypertrophy. Left ventricular diastolic parameters are consistent with Grade I diastolic dysfunction (impaired relaxation). Right Ventricle: The right ventricular size is normal. No increase in right ventricular wall thickness. Right ventricular systolic function is normal. Tricuspid regurgitation signal is inadequate for assessing PA pressure. Left Atrium: Left atrial size was normal in size. Right Atrium: Right atrial size was normal in size. Pericardium: There is no evidence of pericardial effusion. Mitral Valve: The mitral valve is normal in structure.  Trivial mitral valve regurgitation. No evidence of mitral valve stenosis. Tricuspid Valve: The tricuspid valve is normal in structure. Tricuspid valve regurgitation is not demonstrated. Aortic Valve: The aortic valve is tricuspid. Aortic valve regurgitation is not visualized. Aortic valve mean gradient measures 14.0 mmHg. Aortic valve peak gradient measures 25.1 mmHg. Aortic valve area, by VTI measures 2.56 cm. Pulmonic Valve: The pulmonic valve was normal in structure. Pulmonic valve regurgitation is not visualized. Aorta: The aortic root is normal in size and structure. Venous: The inferior vena cava is normal in size with greater than 50% respiratory variability, suggesting right atrial pressure of 3 mmHg. IAS/Shunts: No atrial level shunt detected by color flow Doppler.  LEFT VENTRICLE PLAX 2D LVIDd:         5.20 cm     Diastology LVIDs:         2.50 cm     LV e' medial:    13.90 cm/s LV PW:         1.00 cm     LV E/e' medial:  6.6 LV IVS:        1.00 cm     LV e' lateral:   11.70 cm/s LVOT diam:     2.10 cm     LV E/e' lateral: 7.8 LV SV:         101 LV SV Index:   49 LVOT Area:     3.46 cm  LV Volumes (MOD) LV vol d, MOD A2C: 98.4 ml LV vol d, MOD A4C: 83.5 ml LV vol s, MOD A2C: 32.4 ml LV vol s, MOD A4C: 26.4 ml LV SV MOD A2C:     66.0 ml LV SV MOD A4C:     83.5 ml LV SV MOD BP:      62.1 ml RIGHT VENTRICLE RV Basal diam:  3.10 cm RV S prime:     21.70 cm/s TAPSE (M-mode): 3.1 cm LEFT ATRIUM  Index        RIGHT ATRIUM           Index LA diam:        3.80 cm 1.85 cm/m   RA Area:     16.70 cm LA Vol (A2C):   43.4 ml 21.14 ml/m  RA Volume:   38.30 ml  18.66 ml/m LA Vol (A4C):   54.8 ml 26.70 ml/m LA Biplane Vol: 51.7 ml 25.19 ml/m  AORTIC VALVE AV Area (Vmax):    2.34 cm AV Area (Vmean):   2.43 cm AV Area (VTI):     2.56 cm AV Vmax:           250.50 cm/s AV Vmean:          181.000 cm/s AV VTI:            0.396 m AV Peak Grad:      25.1 mmHg AV Mean Grad:      14.0 mmHg LVOT Vmax:          169.50 cm/s LVOT Vmean:        127.000 cm/s LVOT VTI:          0.292 m LVOT/AV VTI ratio: 0.74  AORTA Ao Root diam: 3.00 cm Ao Asc diam:  3.00 cm MITRAL VALVE MV Area (PHT): 6.48 cm     SHUNTS MV Decel Time: 117 msec     Systemic VTI:  0.29 m MV E velocity: 91.30 cm/s   Systemic Diam: 2.10 cm MV A velocity: 107.00 cm/s MV E/A ratio:  0.85 Dalton McleanMD Electronically signed by Wilfred Lacy Signature Date/Time: 07/05/2021/3:13:09 PM    Final    US Abdomen Limited RUQ (LIVER/GB)  Result Date: 06/12/2021 CLINICAL DATA:  Jaundice and cirrhosis. EXAM: ULTRASOUND ABDOMEN LIMITED RIGHT UPPER QUADRANT COMPARISON:  None. FINDINGS: Gallbladder: No gallstones or wall thickening visualized. No sonographic Murphy sign noted by sonographer. Common bile duct: Diameter: Not well seen.  Approximately 3-4 mm diameter. Liver: Markedly echogenic and heterogeneous liver parenchyma with nodular contour. Portal vein is patent on color Doppler imaging with normal direction of blood flow towards the liver. Other: None. IMPRESSION: Nodular, echogenic liver parenchyma compatible with cirrhosis. No focal abnormality evident although assessment limited due to poor acoustic through transmission. No biliary dilatation Electronically Signed   By: Kennith Center M.D.   On: 06/24/2021 15:02   IR Paracentesis  Result Date: 06/12/2021 INDICATION: Patient with a history of alcohol abuse and cirrhosis presents today with ascites. Interventional Radiology asked to perform a diagnostic and therapeutic paracentesis EXAM: ULTRASOUND GUIDED PARACENTESIS MEDICATIONS: 1% lidocaine 15 mL COMPLICATIONS: None immediate. PROCEDURE: Informed written consent was obtained from the patient after a discussion of the risks, benefits and alternatives to treatment. A timeout was performed prior to the initiation of the procedure. Initial ultrasound scanning demonstrates a large amount of ascites within the left lower abdominal quadrant. The left lower  abdomen was prepped and draped in the usual sterile fashion. 1% lidocaine was used for local anesthesia. Following this, a 19 gauge, 15-cm, Yueh catheter was introduced. An ultrasound image was saved for documentation purposes. The paracentesis was performed. The catheter was removed and a dressing was applied. The patient tolerated the procedure well without immediate post procedural complication. FINDINGS: A total of approximately 800 mL of orange yellow fluid was removed. Samples were sent to the laboratory as requested by the clinical team. IMPRESSION: Successful ultrasound-guided paracentesis yielding 800 mL of peritoneal fluid. Read by: Alwyn Ren, NP Electronically  Signed   By: Marliss Coots M.D.   On: 07/09/2021 16:19   Medications: Infusions:  albumin human 25 g (07/06/21 0502)   sodium chloride      Scheduled Medications:  sodium chloride   Intravenous Once   feeding supplement (NEPRO CARB STEADY)  237 mL Oral TID BM   folic acid  1 mg Oral Daily   furosemide  80 mg Intravenous Q12H   lactulose  30 g Oral TID   lactulose  300 mL Rectal Once   mouth rinse  15 mL Mouth Rinse BID   midodrine  10 mg Oral TID WC   multivitamin  1 tablet Oral QHS   octreotide  200 mcg Subcutaneous Q8H   pantoprazole (PROTONIX) IV  40 mg Intravenous Q12H   prednisoLONE  40 mg Oral Daily   rifaximin  550 mg Oral BID   thiamine  100 mg Oral Daily   Or   thiamine  100 mg Intravenous Daily    have reviewed scheduled and prn medications.  Physical Exam: General: obese, jaundiced, confused but alert enough to voice pain concerns  Heart: tachy Lungs: dec BS at bases Abdomen: obese, ascites-  purewick  in place Extremities: pitting edema     07/06/2021,10:23 AM  LOS: 2 days

## 2021-07-06 NOTE — Progress Notes (Signed)
6:55 PM Notified by patient's RN that patient's husband arrived and asking for palliative care.  He was away to talk to their sons earlier this afternoon when I went back to check on them.  At that time, patient's brother and sister were at bedside, and wanted to wait on his arrival before initiating full comfort care.  I called and talked to patient's husband over the phone.  He requested initiating full comfort care and ordering medications for symptom management.  Full comfort measures initiated.

## 2021-07-06 NOTE — Progress Notes (Signed)
PROGRESS NOTE  Kristine Sims:[id number] DOB: 1971/08/19   PCP: Nelwyn Salisbury, MD  Patient is from: Home.  Lives with family.  DOA: 02-Aug-2021 LOS: 2  Chief complaints:  Chief Complaint  Patient presents with   Shortness of Breath   Jaundice     Brief Narrative / Interim history: 50 year old F with PMH of EtOH abuse, alcoholic neuropathy, anxiety, depression, migraine and IBS presenting with abdominal swelling, jaundice, lethargy, SOB, DOE and edema, and admitted for decompensated liver cirrhosis with ascites, AKI, encephalopathy and hyperbilirubinemia.  GI and nephrology consulted.  She underwent paracentesis with removal of 900 cc fluid.  Fluid culture NGTD.  Cell count was not obtained.  GI, nephrology and palliative medicine consulted.    Patient remained anuric despite IV Lasix, octreotide and albumin.  Hyponatremia and hyperbilirubinemia persisted.  Discussed CODE STATUS with patient and patient's husband at bedside.  Patient prefers to be DNR/DNI.  Patient's husband wants to wait on her brother and sister before moving forward with full comfort care.   Subjective: Seen and examined earlier this morning.  No major events overnight of this morning.  Complains about pain and indigestion.  Goal of care discussion as above.  Objective: Vitals:   07/05/21 1800 07/05/21 2030 07/06/21 0000 07/06/21 0353  BP: (!) 133/57 (!) 109/59 119/70 116/75  Pulse: (!) 34 94 99 96  Resp: (!) 32 20 19 (!) 22  Temp: 97.9 F (36.6 C) 99.3 F (37.4 C) 98.2 F (36.8 C) 98.7 F (37.1 C)  TempSrc: Oral Oral Oral Oral  SpO2: 93% 93% 92% 93%  Weight:   105.5 kg   Height:        Intake/Output Summary (Last 24 hours) at 07/06/2021 1526 Last data filed at 07/06/2021 0600 Gross per 24 hour  Intake 449.86 ml  Output 0 ml  Net 449.86 ml   Filed Weights   07/05/21 0100 07/06/21 0000  Weight: 104.4 kg 105.5 kg    Examination:  GENERAL: No apparent distress.  Nontoxic. HEENT: MMM.  Sclerae  icteric. NECK: Supple.  No apparent JVD.  RESP: On 3 L.  No IWOB.  Fair aeration bilaterally. CVS:  RRR. Heart sounds normal.  ABD/GI/GU: BS+. Abd slightly distended.  Mild diffuse tenderness. MSK/EXT:  Moves extremities. No apparent deformity.  BLE edema SKIN: Jaundice all over. NEURO: Awake and alert. Oriented appropriately.  No apparent focal neuro deficit. PSYCH: Calm. Normal affect.   Procedures:  10/25-paracentesis with removal of 900 cc fluid.  Microbiology summarized: COVID-19 and influenza PCR nonreactive. Peritoneal fluid culture NGTD.  Assessment & Plan: Decompensated alcoholic liver cirrhosis with ascites and portal hypertension Elevated liver enzymes/hyperbilirubinemia Hepatic encephalopathy Alcohol abuse/dependence Coagulopathy -S/p paracentesis with removal of 900 cc fluid.  No cell count.  GS with GPC but culture negative. -MELD-Na score of 34 points suggesting 65 to 66% 90-day mortality -Continue PPI, prednisone and rifaximin per GI -Continue CIWA with as needed Ativan -Continue supportive care and monitoring -Continue lactulose -Very sick.  Grim prognosis.  Leaning toward full comfort care.   Severe hypochloremic hyponatremia: Likely due to alcohol, cirrhosis and renal failure.   -Nephrology signed off.  Acute metabolic encephalopathy/hepatic encephalopathy:  -Continue lactulose -Aspiration precaution -Appreciate input by SLP-full liquid diet  AKI/azotemia/anuria-concern for hepatorenal syndrome.  Remains anuric despite IV Lasix, octreotide and albumin Anion gap metabolic acidosis: Likely due to the above. Recent Labs    08-02-21 1310 08-02-21 1813 07/05/21 0201 07/05/21 0731 07/05/21 1156 07/05/21 1556 07/06/21 0324  BUN 31*  33* 36* 41* 43* 47* 54*  CREATININE 1.68* 1.64* 1.94* 2.22* 2.37* 2.41* 2.46*  -On octreotide, albumin and IV Lasix per nephrology -Nephrology signed off.   Possible Pancreatitis/ elevated lipase: Lipase elevated to 407.   RUQ ultrasound without gallstone  Leukocytosis at 15.6k: could be demargination.  Peritoneal fluid analysis does not suggest SBP.  Doubt pneumonia or UTI. -Continue monitoring   Macrocytic anemia: Likely due to alcohol liver cirrhosis.  Hgb dropped about 4 g over the last 3 weeks but relatively stable.  Slight drop in Hgb likely dilutional from anemia/renal failure.. 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  Very grim prognosis.  Discussed with patient and patient's husband at bedside.  Patient prefers to be DNR/DNI.  Husband in agreement.  Leaning toward home comfort care but likes to wait on patient's brother and sister. -Palliative medicine has been consulted  Inadequate oral intake Body mass index is 41.2 kg/m. Nutrition Problem: Inadequate oral intake Etiology: lethargy/confusion Signs/Symptoms: per patient/family report Interventions: Nepro shake, MVI   DVT prophylaxis:  SCDs Start: 06/27/2021 1747  Code Status: Full code Family Communication: Updated patient's husband at bedside Level of care: Progressive Status is: Inpatient  Remains inpatient appropriate because: Decompensated liver failure with acute encephalopathy, AKI and hepatorenal syndrome and hyponatremia.       Consultants:  Gastroenterology Nephrology Palliative medicine   Sch Meds:  Scheduled Meds:  sodium chloride   Intravenous Once    feeding supplement (NEPRO CARB STEADY)  237 mL Oral TID BM   folic acid  1 mg Oral Daily   lactulose  30 g Oral TID   lactulose  300 mL Rectal Once   mouth rinse  15 mL Mouth Rinse BID   midodrine  10 mg Oral TID WC   multivitamin  1 tablet Oral QHS   octreotide  200 mcg Subcutaneous Q8H   pantoprazole (PROTONIX) IV  40 mg Intravenous Q12H   prednisoLONE  40 mg Oral Daily   rifaximin  550 mg Oral BID   thiamine  100 mg Oral Daily   Or   thiamine  100 mg Intravenous Daily   Continuous Infusions:  albumin human 25 g (07/06/21 1340)   sodium chloride     PRN Meds:.albuterol, alum & mag hydroxide-simeth, famotidine, LORazepam **OR** LORazepam, ondansetron **OR** ondansetron (ZOFRAN) IV  Antimicrobials: Anti-infectives (From admission, onward)    Start     Dose/Rate Route Frequency Ordered Stop   07/05/21 1145  rifaximin (XIFAXAN) tablet 550 mg        550 mg Oral 2 times daily 07/05/21 1056     07/05/21 0815  cefTRIAXone (ROCEPHIN) 2 g in sodium chloride 0.9 % 100 mL IVPB  Status:  Discontinued        2 g 200 mL/hr over 30 Minutes Intravenous Every 24 hours 07/05/21 0728 07/05/21 1748        I have personally reviewed the following labs and images: CBC: Recent Labs  Lab 06/23/2021 1310 06/14/2021 2205 07/05/21 0201 07/05/21 0731 07/05/21 1556 07/06/21  0324  WBC 15.6*  --  16.0*  --   --  15.0*  NEUTROABS 12.5*  --   --   --   --  12.4*  HGB 8.3* 7.8* 7.4* 7.1* 7.2* 6.6*  HCT 23.3* 21.9* 21.2* 20.3* 19.9* 18.9*  MCV 106.9*  --  107.6*  --   --  109.9*  PLT 172  --  156  --   --  141*   BMP &GFR Recent Labs  Lab 07/05/21 0201 07/05/21 0731 07/05/21 1156 07/05/21 1556 07/06/21 0324  NA 117* 117* 117* 118* 116*  K 4.4 4.6 4.7 5.0 4.4  CL 75* 75* 76* 74* 74*  CO2 20* 23 23 23 23   GLUCOSE 73 84 88 98 160*  BUN 36* 41* 43* 47* 54*  CREATININE 1.94* 2.22* 2.37* 2.41* 2.46*  CALCIUM 8.4* 8.0* 8.1* 8.4* 8.1*  MG  --   --   --   --  2.9*  PHOS  --   --   --   --  5.8*    Estimated Creatinine Clearance: 31.1 mL/min (A) (by C-G formula based on SCr of 2.46 mg/dL (H)). Liver & Pancreas: Recent Labs  Lab 07/06/2021 1310 07/05/21 0201 07/06/21 0324  AST 232* 209* 187*  ALT 59* 58* 53*  ALKPHOS 157* 148* 114  BILITOT 21.7* 22.9* 23.7*  PROT 7.7 7.2 7.3  ALBUMIN 2.4* 2.2* 2.7*   Recent Labs  Lab 06/10/2021 1310 07/06/21 0324  LIPASE 407* 205*   Recent Labs  Lab 06/10/2021 1813 07/06/21 0324  AMMONIA 75* 57*   Diabetic: No results for input(s): HGBA1C in the last 72 hours. No results for input(s): GLUCAP in the last 168 hours. Cardiac Enzymes: Recent Labs  Lab 07/06/21 0324  CKTOTAL 35*   Recent Labs    06/14/21 1456  PROBNP 41.0   Coagulation Profile: Recent Labs  Lab 06/28/2021 1310 07/05/21 0201 07/06/21 0324  INR 1.3* 1.4* 1.4*   Thyroid Function Tests: Recent Labs    07/01/2021 1813  TSH 3.263   Lipid Profile: No results for input(s): CHOL, HDL, LDLCALC, TRIG, CHOLHDL, LDLDIRECT in the last 72 hours. Anemia Panel: Recent Labs    06/24/2021 1813 07/05/21 1156  VITAMINB12 752  --   FOLATE 4.4*  --   TIBC  --  112*  IRON  --  112   Urine analysis:    Component Value Date/Time   COLORURINE Orange (A) 06/14/2021 1456   APPEARANCEUR Sl Cloudy (A) 06/14/2021 1456   LABSPEC Color Interference (A) 06/14/2021 1456   PHURINE Color Interference (A) 06/14/2021 1456   GLUCOSEU Color Interference (A) 06/14/2021 1456   HGBUR Color Interference (A) 06/14/2021 1456   BILIRUBINUR Color Interference (A) 06/14/2021 1456   KETONESUR Color Interference (A) 06/14/2021 1456   UROBILINOGEN Color Interference (A) 06/14/2021 1456   NITRITE Color Interference (A) 06/14/2021 1456   LEUKOCYTESUR Color Interference (A) 06/14/2021 1456   Sepsis Labs: Invalid input(s): PROCALCITONIN, LACTICIDVEN  Microbiology: Recent Results (from the past 240 hour(s))  Acid Fast Smear (AFB)     Status: None   Collection Time: 06/15/2021  4:04 PM   Specimen:  Abdomen; Peritoneal Fluid  Result Value Ref Range Status   AFB Specimen Processing Concentration  Final   Acid Fast Smear Negative  Final    Comment: (NOTE) Performed At: [facility] [address] [place], [place] [id number] MD Jolene Schimke    Source (AFB) FLUID  Final    Comment: PERITONEAL ABDOMEN Performed  at [facility], [address], [place], [place] [address]   Culture, body fluid w Gram Stain-bottle     Status: None (Preliminary result)   Collection Time: 06/18/2021  4:04 PM   Specimen: Fluid  Result Value Ref Range Status   Specimen Description FLUID PERITONEAL ABDOMEN  Final   Special Requests BOTTLES DRAWN AEROBIC AND ANAEROBIC  Final   Culture   Final    NO GROWTH 2 DAYS Performed at [facility], [address], [place], [place] [address]    Report Status PENDING  Incomplete  Gram stain     Status: None   Collection Time: 07/05/2021  4:04 PM   Specimen: Fluid  Result Value Ref Range Status   Specimen Description FLUID PERITONEAL ABDOMEN  Final   Special Requests NONE  Final   Gram Stain   Final    NO ORGANISMS SEEN SQUAMOUS EPITHELIAL CELLS PRESENT WBC PRESENT, PREDOMINANTLY MONONUCLEAR GRAM POSITIVE COCCI CYTOSPIN SMEAR Performed at [facility], [address], [place], [place] [address]    Report Status 06/18/2021 FINAL  Final  Resp Panel by RT-PCR (Flu A&B, Covid) Nasopharyngeal Swab     Status: None   Collection Time: 06/18/2021  4:11 PM   Specimen: Nasopharyngeal Swab; Nasopharyngeal(NP) swabs in vial transport medium  Result Value Ref Range Status   SARS Coronavirus 2 by RT PCR NEGATIVE NEGATIVE Final    Comment: (NOTE) SARS-CoV-2 target nucleic acids are NOT DETECTED.  The SARS-CoV-2 RNA is generally detectable in upper respiratory specimens during the acute phase of infection. The lowest concentration of SARS-CoV-2 viral copies this assay can detect is 138 copies/mL. A negative result does not preclude  SARS-Cov-2 infection and should not be used as the sole basis for treatment or other patient management decisions. A negative result may occur with  improper specimen collection/handling, submission of specimen other than nasopharyngeal swab, presence of viral mutation(s) within the areas targeted by this assay, and inadequate number of viral copies(<138 copies/mL). A negative result must be combined with clinical observations, patient history, and epidemiological information. The expected result is Negative.  Fact Sheet for Patients:  BloggerCourse.com  Fact Sheet for Healthcare Providers:  SeriousBroker.it  This test is no t yet approved or cleared by the [place] FDA and  has been authorized for detection and/or diagnosis of SARS-CoV-2 by FDA under an Emergency Use Authorization (EUA). This EUA will remain  in effect (meaning this test can be used) for the duration of the COVID-19 declaration under Section 564(b)(1) of the Act, 21 U.S.C.section 360bbb-3(b)(1), unless the authorization is terminated  or revoked sooner.       Influenza A by PCR NEGATIVE NEGATIVE Final   Influenza B by PCR NEGATIVE NEGATIVE Final    Comment: (NOTE) The Xpert Xpress SARS-CoV-2/FLU/RSV plus assay is intended as an aid in the diagnosis of influenza from Nasopharyngeal swab specimens and should not be used as a sole basis for treatment. Nasal washings and aspirates are unacceptable for Xpert Xpress SARS-CoV-2/FLU/RSV testing.  Fact Sheet for Patients: BloggerCourse.com  Fact Sheet for Healthcare Providers: SeriousBroker.it  This test is not yet approved or cleared by the [place] FDA and has been authorized for detection and/or diagnosis of SARS-CoV-2 by FDA under an Emergency Use Authorization (EUA). This EUA will remain in effect (meaning this test can be used) for the duration of  the COVID-19 declaration under Section 564(b)(1) of the Act, 21 U.S.C. section 360bbb-3(b)(1), unless the authorization is  terminated or revoked.  Performed at [facility], [address] [place], [place] [address]     Radiology Studies: No results found.    Kynnedi Zweig T. Kare Dado [facility]  If 7PM-7AM, please contact night-coverage [email or web address] 07/06/2021, 3:26 PM

## 2021-07-07 DIAGNOSIS — N179 Acute kidney failure, unspecified: Secondary | ICD-10-CM | POA: Diagnosis not present

## 2021-07-07 DIAGNOSIS — K7200 Acute and subacute hepatic failure without coma: Secondary | ICD-10-CM | POA: Diagnosis not present

## 2021-07-07 DIAGNOSIS — Z515 Encounter for palliative care: Secondary | ICD-10-CM

## 2021-07-07 DIAGNOSIS — K7031 Alcoholic cirrhosis of liver with ascites: Secondary | ICD-10-CM | POA: Diagnosis not present

## 2021-07-07 DIAGNOSIS — R7989 Other specified abnormal findings of blood chemistry: Secondary | ICD-10-CM | POA: Diagnosis not present

## 2021-07-07 LAB — CYTOLOGY - NON PAP

## 2021-07-07 MED ORDER — HYDROMORPHONE HCL 1 MG/ML IJ SOLN
2.0000 mg | INTRAMUSCULAR | Status: DC | PRN
  Administered 2021-07-07: 2 mg via INTRAVENOUS
  Filled 2021-07-07: qty 2

## 2021-07-07 MED ORDER — HYDROMORPHONE HCL PF 10 MG/ML IJ SOLN
1.0000 mg/h | INTRAMUSCULAR | Status: DC
Start: 2021-07-07 — End: 2021-07-08
  Administered 2021-07-07: 1 mg/h via INTRAVENOUS
  Administered 2021-07-08: 1.25 mg/h via INTRAVENOUS
  Filled 2021-07-07: qty 5
  Filled 2021-07-07: qty 2.5

## 2021-07-07 NOTE — Progress Notes (Addendum)
PROGRESS NOTE  Kristine Sims ZOX:[id number] DOB: May 01, 1971   PCP: Nelwyn Salisbury, MD  Patient is from: Home.  Lives with family.  DOA: 07/07/2021 LOS: 3  Chief complaints:  Chief Complaint  Patient presents with   Shortness of Breath   Jaundice     Brief Narrative / Interim history: 50 year old F with PMH of EtOH abuse, alcoholic neuropathy, anxiety, depression, migraine and IBS presenting with abdominal swelling, jaundice, lethargy, SOB, DOE and edema, and admitted for decompensated liver cirrhosis with ascites, AKI, encephalopathy and hyperbilirubinemia.  GI and nephrology consulted.  She underwent paracentesis with removal of 900 cc fluid.  Fluid culture NGTD.  Cell count was not obtained.  GI, nephrology and palliative medicine consulted.    Patient remained anuric despite IV Lasix, octreotide and albumin.  Hyponatremia and hyperbilirubinemia persisted. Goal of care discussion initiated with patient and patient's husband at bedside.  Patient was clear with her wish.  She is not interested in dialysis or being on liver transplant list.  She wanted to be comfortable and die peacefully.  Husband is supportive and in agreement.  Comfort care initiated on 07/06/2021.   Subjective: Seen and examined earlier this morning.  She is somnolent.  Not in distress.  Has intermittent apnea with deep inspiration at times.  Patient's husband and sister at bedside.   Objective: Vitals:   07/06/21 0000 07/06/21 0353 07/06/21 1642 07/07/21 0638  BP: 119/70 116/75 125/70   Pulse: 99 96 100   Resp: 19 (!) 22 (!) 22 20  Temp: 98.2 F (36.8 C) 98.7 F (37.1 C) 97.7 F (36.5 C)   TempSrc: Oral Oral Oral   SpO2: 92% 93%  (!) 87%  Weight: 105.5 kg     Height:       No intake or output data in the 24 hours ending 07/07/21 1837  Filed Weights   07/05/21 0100 07/06/21 0000  Weight: 104.4 kg 105.5 kg    Examination:  GENERAL: No apparent distress.  RESP: On RA.  Intermittent apnea with deep  inspiration CVS:  RRR. Heart sounds normal.  MSK/EXT: Lower extremity edema. SKIN: Jaundice all over. NEURO: Somnolent.  No focal neurodeficit PSYCH: Calm.  No distress or agitation.  Procedures:  10/25-paracentesis with removal of 900 cc fluid.  Microbiology summarized: COVID-19 and influenza PCR nonreactive. Peritoneal fluid culture NGTD.  Assessment & Plan: End-of-life care/full comfort care/DNR/DNI -Full comfort care initiated on 07/07/2027. -Continue as needed Dilaudid, Ativan, glycopyrrolate, Zofran... -Emotional support to family -Anticipate in hospital death  Addendum Some discomfort, some signs of discomfort, work of breathing, bradypnea and gurgling per patient's RN.  -Increase Dilaudid to 2 mg every 2 hours.  If no improvement with this, start Dilaudid infusion  Decompensated alcoholic liver cirrhosis with ascites and portal hypertension Elevated liver enzymes/hyperbilirubinemia Hepatic encephalopathy Alcohol abuse/dependence Coagulopathy Severe hypochloremic hyponatremia: Likely due to alcohol, cirrhosis and renal failure.   Acute metabolic encephalopathy/hepatic encephalopathy:  AKI/azotemia/anuria Anion gap metabolic acidosis: Possible Pancreatitis/ elevated lipase Leukocytosis at 15.6k Macrocytic anemia:  Epigastric discomfort/indigestion T12 compression fracture-had fall 3 to 4 weeks ago Physical deconditioning/debility fall at home Anxiety/depression: Goal of care counseling Inadequate oral intake Morbid obesity Body mass index is 41.2 kg/m. Nutrition Problem: Inadequate oral intake Etiology: lethargy/confusion Signs/Symptoms: per patient/family report Interventions: Nepro shake, MVI   DVT prophylaxis:    Code Status: Full code Family Communication: Updated patient's husband and sister at bedside. Level of care: Progressive Status is: Inpatient  Remains inpatient appropriate because: Anticipated in hospital  death       Consultants:   Gastroenterology-signed off Nephrology-signed off Palliative medicine   Sch Meds:  Scheduled Meds:   Continuous Infusions:  chlorproMAZINE (THORAZINE) IV     HYDROmorphone     PRN Meds:.antiseptic oral rinse, chlorproMAZINE (THORAZINE) IV, diphenhydrAMINE, glycopyrrolate **OR** glycopyrrolate **OR** glycopyrrolate, haloperidol **OR** haloperidol **OR** haloperidol lactate, HYDROmorphone (DILAUDID) injection, LORazepam **OR** LORazepam **OR** LORazepam, ondansetron **OR** ondansetron (ZOFRAN) IV, polyvinyl alcohol  Antimicrobials: Anti-infectives (From admission, onward)    Start     Dose/Rate Route Frequency Ordered Stop   07/05/21 1145  rifaximin (XIFAXAN) tablet 550 mg  Status:  Discontinued        550 mg Oral 2 times daily 07/05/21 1056 07/06/21 1851   07/05/21 0815  cefTRIAXone (ROCEPHIN) 2 g in sodium chloride 0.9 % 100 mL IVPB  Status:  Discontinued        2 g 200 mL/hr over 30 Minutes Intravenous Every 24 hours 07/05/21 0728 07/05/21 1748        I have personally reviewed the following labs and images: CBC: Recent Labs  Lab Jul 10, 2021 1310 2021/07/10 2205 07/05/21 0201 07/05/21 0731 07/05/21 1556 07/06/21 0324  WBC 15.6*  --  16.0*  --   --  15.0*  NEUTROABS 12.5*  --   --   --   --  12.4*  HGB 8.3* 7.8* 7.4* 7.1* 7.2* 6.6*  HCT 23.3* 21.9* 21.2* 20.3* 19.9* 18.9*  MCV 106.9*  --  107.6*  --   --  109.9*  PLT 172  --  156  --   --  141*   BMP &GFR Recent Labs  Lab 07/05/21 0201 07/05/21 0731 07/05/21 1156 07/05/21 1556 07/06/21 0324  NA 117* 117* 117* 118* 116*  K 4.4 4.6 4.7 5.0 4.4  CL 75* 75* 76* 74* 74*  CO2 20* 23 23 23 23   GLUCOSE 73 84 88 98 160*  BUN 36* 41* 43* 47* 54*  CREATININE 1.94* 2.22* 2.37* 2.41* 2.46*  CALCIUM 8.4* 8.0* 8.1* 8.4* 8.1*  MG  --   --   --   --  2.9*  PHOS  --   --   --   --  5.8*   Estimated Creatinine Clearance: 31.1 mL/min (A) (by C-G formula based on SCr of 2.46 mg/dL (H)). Liver & Pancreas: Recent Labs  Lab  07/10/2021 1310 07/05/21 0201 07/06/21 0324  AST 232* 209* 187*  ALT 59* 58* 53*  ALKPHOS 157* 148* 114  BILITOT 21.7* 22.9* 23.7*  PROT 7.7 7.2 7.3  ALBUMIN 2.4* 2.2* 2.7*   Recent Labs  Lab Jul 10, 2021 1310 07/06/21 0324  LIPASE 407* 205*   Recent Labs  Lab 2021/07/10 1813 07/06/21 0324  AMMONIA 75* 57*   Diabetic: No results for input(s): HGBA1C in the last 72 hours. No results for input(s): GLUCAP in the last 168 hours. Cardiac Enzymes: Recent Labs  Lab 07/06/21 0324  CKTOTAL 35*   Recent Labs    06/14/21 1456  PROBNP 41.0   Coagulation Profile: Recent Labs  Lab 07/10/21 1310 07/05/21 0201 07/06/21 0324  INR 1.3* 1.4* 1.4*   Thyroid Function Tests: No results for input(s): TSH, T4TOTAL, FREET4, T3FREE, THYROIDAB in the last 72 hours.  Lipid Profile: No results for input(s): CHOL, HDL, LDLCALC, TRIG, CHOLHDL, LDLDIRECT in the last 72 hours. Anemia Panel: Recent Labs    07/05/21 1156  TIBC 112*  IRON 112   Urine analysis:    Component Value Date/Time   COLORURINE Orange (A)  06/14/2021 1456   APPEARANCEUR Sl Cloudy (A) 06/14/2021 1456   LABSPEC Color Interference (A) 06/14/2021 1456   PHURINE Color Interference (A) 06/14/2021 1456   GLUCOSEU Color Interference (A) 06/14/2021 1456   HGBUR Color Interference (A) 06/14/2021 1456   BILIRUBINUR Color Interference (A) 06/14/2021 1456   KETONESUR Color Interference (A) 06/14/2021 1456   UROBILINOGEN Color Interference (A) 06/14/2021 1456   NITRITE Color Interference (A) 06/14/2021 1456   LEUKOCYTESUR Color Interference (A) 06/14/2021 1456   Sepsis Labs: Invalid input(s): PROCALCITONIN, LACTICIDVEN  Microbiology: Recent Results (from the past 240 hour(s))  Acid Fast Smear (AFB)     Status: None   Collection Time: 06/10/2021  4:04 PM   Specimen: Abdomen; Peritoneal Fluid  Result Value Ref Range Status   AFB Specimen Processing Concentration  Final   Acid Fast Smear Negative  Final    Comment:  (NOTE) Performed At: [facility] [address] [place], [place] [id number] Jolene Schimke MD IN:[id number]    Source (AFB) FLUID  Final    Comment: PERITONEAL ABDOMEN Performed at [facility], [address], [place], [place] [address]   Culture, body fluid w Gram Stain-bottle     Status: None (Preliminary result)   Collection Time: 06/28/2021  4:04 PM   Specimen: Fluid  Result Value Ref Range Status   Specimen Description FLUID PERITONEAL ABDOMEN  Final   Special Requests BOTTLES DRAWN AEROBIC AND ANAEROBIC  Final   Culture   Final    NO GROWTH 3 DAYS Performed at [facility], [address] [place], [place] [address]    Report Status PENDING  Incomplete  Gram stain     Status: None   Collection Time: 07/05/2021  4:04 PM   Specimen: Fluid  Result Value Ref Range Status   Specimen Description FLUID PERITONEAL ABDOMEN  Final   Special Requests NONE  Final   Gram Stain   Final    NO ORGANISMS SEEN SQUAMOUS EPITHELIAL CELLS PRESENT WBC PRESENT, PREDOMINANTLY MONONUCLEAR GRAM POSITIVE COCCI CYTOSPIN SMEAR Performed at [facility], [address], [place], [place] [address]    Report Status 06/17/2021 FINAL  Final  Resp Panel by RT-PCR (Flu A&B, Covid) Nasopharyngeal Swab     Status: None   Collection Time: 07/06/2021  4:11 PM   Specimen: Nasopharyngeal Swab; Nasopharyngeal(NP) swabs in vial transport medium  Result Value Ref Range Status   SARS Coronavirus 2 by RT PCR NEGATIVE NEGATIVE Final    Comment: (NOTE) SARS-CoV-2 target nucleic acids are NOT DETECTED.  The SARS-CoV-2 RNA is generally detectable in upper respiratory specimens during the acute phase of infection. The lowest concentration of SARS-CoV-2 viral copies this assay can detect is 138 copies/mL. A negative result does not preclude SARS-Cov-2 infection and should not be used as the sole basis for treatment or other patient management decisions. A negative result may occur with   improper specimen collection/handling, submission of specimen other than nasopharyngeal swab, presence of viral mutation(s) within the areas targeted by this assay, and inadequate number of viral copies(<138 copies/mL). A negative result must be combined with clinical observations, patient history, and epidemiological information. The expected result is Negative.  Fact Sheet for Patients:  BloggerCourse.com  Fact Sheet for Healthcare Providers:  SeriousBroker.it  This test is no t yet approved or cleared by the [place] FDA and  has been authorized for detection and/or diagnosis of SARS-CoV-2 by FDA under an Emergency Use Authorization (EUA). This EUA will remain  in effect (meaning this test can be used) for the duration of the COVID-19 declaration under Section 564(b)(1) of the Act, 21 U.S.C.section 360bbb-3(b)(1), unless the authorization is terminated  or revoked sooner.       Influenza A by PCR NEGATIVE NEGATIVE Final   Influenza B by PCR NEGATIVE NEGATIVE Final    Comment: (NOTE) The Xpert Xpress SARS-CoV-2/FLU/RSV plus assay is intended as an aid in the diagnosis of influenza from Nasopharyngeal swab specimens and should not be used as a sole basis for treatment. Nasal washings and aspirates are unacceptable for Xpert Xpress SARS-CoV-2/FLU/RSV testing.  Fact Sheet for Patients: BloggerCourse.com  Fact Sheet for Healthcare Providers: SeriousBroker.it  This test is not yet approved or cleared by the [place] FDA and has been authorized for detection and/or diagnosis of SARS-CoV-2 by FDA under an Emergency Use Authorization (EUA). This EUA will remain in effect (meaning this test can be used) for the duration of the COVID-19 declaration under Section 564(b)(1) of the Act, 21 U.S.C. section 360bbb-3(b)(1), unless the authorization is terminated  or revoked.  Performed at [facility], [address] [place], [place] [address]     Radiology Studies: No results found.    Dalten Ambrosino T. Dalyah Pla [facility]  If 7PM-7AM, please contact night-coverage [email or web address] 07/07/2021, 6:37 PM

## 2021-07-08 DIAGNOSIS — K7031 Alcoholic cirrhosis of liver with ascites: Secondary | ICD-10-CM | POA: Diagnosis present

## 2021-07-08 DIAGNOSIS — Z66 Do not resuscitate: Secondary | ICD-10-CM | POA: Diagnosis present

## 2021-07-08 DIAGNOSIS — D689 Coagulation defect, unspecified: Secondary | ICD-10-CM | POA: Diagnosis present

## 2021-07-08 DIAGNOSIS — Z515 Encounter for palliative care: Secondary | ICD-10-CM

## 2021-07-09 LAB — CULTURE, BODY FLUID W GRAM STAIN -BOTTLE: Culture: NO GROWTH

## 2021-07-09 LAB — BPAM RBC
Blood Product Expiration Date: 202211032359
Unit Type and Rh: 5100

## 2021-07-09 LAB — TYPE AND SCREEN
ABO/RH(D): O POS
Antibody Screen: NEGATIVE
Unit division: 0

## 2021-07-11 NOTE — Progress Notes (Signed)
Oxygen has been removed per husband's request.

## 2021-07-11 NOTE — Progress Notes (Signed)
Per husband request, we titrated dilaudid drip to 5mg /hr.

## 2021-07-11 NOTE — Death Summary Note (Signed)
DEATH SUMMARY   Patient Details  Name: Kristine Sims MRN: [id number] DOB: May 17, 1971  Admission/Discharge Information   Admit Date:  07-10-2021  Date of Death: Date of Death: 07/14/21  Time of Death: Time of Death: 1455  Length of Stay: 4  Referring Physician: Nelwyn Salisbury, MD   Reason(s) for Hospitalization  Shortness of breath and jaundice  Diagnoses  Preliminary cause of death: Alcoholic liver failure (HCC) Secondary Diagnoses (including complications and co-morbidities):  Active Problems:   Alcoholic peripheral neuropathy (HCC)   Alcohol abuse   Other ascites   Jaundice   Hyponatremia   Elevated lipase   AKI (acute kidney injury) (HCC)   Abnormal LFTs   Acute liver failure   Alcoholic cirrhosis of liver with ascites (HCC)   Coagulopathy (HCC)   End of life care   Hyperbilirubinemia   DNR (do not resuscitate) Fluid overload   Brief Hospital Course (including significant findings, care, treatment, and services provided and events leading to death)  Kristine Sims is a 50 y.o. year old female with PMH of EtOH abuse, alcoholic neuropathy, anxiety, depression, migraine and IBS presenting with abdominal swelling, jaundice, lethargy, SOB, DOE and edema, and admitted for decompensated liver cirrhosis with ascites, AKI, encephalopathy, coagulopathy and hyperbilirubinemia.  GI and nephrology consulted.  She underwent paracentesis with removal of 900 cc fluid.  Fluid culture NGTD.  Cell count was not obtained.  GI, nephrology and palliative medicine consulted.     Patient remained anuric despite IV Lasix, octreotide and albumin.  Hyponatremia and hyperbilirubinemia persisted. Goal of care discussion initiated with patient and patient's husband at bedside.  Patient was clear with her wishes.  She is not interested in dialysis or being on liver transplant list.  She wanted to be comfortable and die peacefully.  Husband, brother and sister were supportive and in agreement.  Comfort  care initiated on 2021/07/12, and she passed away peacefully on Jul 14, 2021 with patient's husband and brother at bedside.  Pertinent Labs and Studies  Significant Diagnostic Studies DG Chest 2 View  Result Date: 2021-07-10 CLINICAL DATA:  Shortness of breath EXAM: CHEST - 2 VIEW COMPARISON:  07/20/2016 FINDINGS: Borderline heart size, likely accentuated by AP semi upright technique and low lung volumes. Mild pulmonary vascular congestion. Small focal opacity at the right lung base. No pleural effusion or pneumothorax. IMPRESSION: 1. Small focal opacity at the right lung base may reflect atelectasis or pneumonia. Recommend repeat inspiratory PA and lateral radiographs of the chest for further evaluation. 2. Mild pulmonary vascular congestion. Electronically Signed   By: Duanne Guess D.O.   On: 2021/07/10 16:50   CT Abdomen Pelvis W Contrast  Result Date: 06/15/2021 CLINICAL DATA:  Jaundice, abdominal swelling for 4 days. EXAM: CT ABDOMEN AND PELVIS WITH CONTRAST TECHNIQUE: Multidetector CT imaging of the abdomen and pelvis was performed using the standard protocol following bolus administration of intravenous contrast. CONTRAST:  ISOVUE-300 IOPAMIDOL (ISOVUE-300) INJECTION 61% COMPARISON:  None. FINDINGS: Lower chest: Mild atelectasis in the right lower lobe Hepatobiliary: Mild hepatomegaly with diffuse heterogeneity throughout the hepatic parenchyma, with extensive regions of hypodensity as well as some scattered interspersed irregular confluent higher density regions in the liver. There is mild perihepatic ascites and some lobularity of the anterior margin of the left hepatic lobe. There appears to be narrowing of the hepatic veins near the IVC, for example on image 23 series 2. Trace fluid adjacent to the gallbladder which has faintly accentuated mucosal enhancement. No obvious focal lesion in  the liver. No biliary dilatation is observed. Overall the appearance is probably a combination of  cirrhosis, steatosis, and in particular acute hepatitis, correlation with hepatitis panel results recommended. Pancreas: Unremarkable Spleen: Unremarkable Adrenals/Urinary Tract: 2.4 by 1.6 cm left adrenal mass with relative washout of 35%, indeterminate. The kidneys appear normal. Stomach/Bowel: Wall thickening in the ascending colon may be from nondistention but inflammation is not excluded. The appendix is somewhat obscured by surrounding ascites in general I am skeptical of acute appendicitis. Vascular/Lymphatic: Splenorenal shunting. Perisplenic collateral venous structures. Small uphill varix along the distal esophagus. Reproductive: IUD satisfactorily positioned in the uterus. Small uterine fibroids. Other: Mild ascites. Musculoskeletal: 55% compression fracture at the T12 level, cannot exclude a late subacute component given the mild sclerosis along the superior portion. 6 mm of associated posterior bony retropulsion. Sacralized L5 level. Grade 1 degenerative anterolisthesis at L3-4. IMPRESSION: 1. Diffuse liver disease with heterogeneous enhancement, background hypodensity, left hepatic lobe anterior nodularity, narrowing of the hepatic veins, hepatomegaly, perihepatic ascites, and no biliary dilatation. The appearance is suspicious for acute hepatitis superimposed on hepatic steatosis and probable hepatic cirrhosis. Correlate with hepatitis panel results. 2. Portal venous hypertension with splenorenal shunting and perisplenic collateral venous structures. Small uphill varices along the distal esophagus. 3. Small left adrenal mass is indeterminate based on washout characteristics. Although probably benign, if further imaging workup is warranted, adrenal protocol MRI could be utilized. 4. 55% compression fracture at T12 with some middle column involvement. This is likely late subacute given the slight sclerosis along the superior portion. There is 6 mm of associated posterior bony retropulsion. No current  paraspinal edema. The L5 level is sacralized. 5. Small uterine fibroids. 6. There is wall thickening in the ascending colon which is nonspecific although low-grade colitis is not excluded. No substantial atheromatous disease to indicate a vascular cause. Electronically Signed   By: Gaylyn Rong M.D.   On: 06/15/2021 11:30   US RENAL  Result Date: 2021/07/25 CLINICAL DATA:  Initial evaluation for acute kidney failure. EXAM: RENAL / URINARY TRACT ULTRASOUND COMPLETE COMPARISON:  Prior CT from 06/15/2021. FINDINGS: Right Kidney: Renal measurements: 11.2 x 5.0 x 5.5 cm = volume: 116.7 mL. Renal echogenicity within normal limits. No nephrolithiasis or hydronephrosis. No focal renal mass. Left Kidney: Renal measurements: 11.3 x 5.2 x 5.2 cm = volume: 159.3 mL. Renal echogenicity within normal limits. No nephrolithiasis or hydronephrosis. No focal renal mass. Bladder: Appears normal for degree of bladder distention. Other: Trace ascites noted adjacent to the liver within the right upper quadrant. Probable small right pleural effusion noted as well. No new hepatic cirrhosis partially visualized. IMPRESSION: 1. Normal renal ultrasound. No hydronephrosis or other acute abnormality. 2. Trace perihepatic ascites and small right pleural effusion. 3. Hepatic cirrhosis, partially visualized. Electronically Signed   By: Rise Mu M.D.   On: 07-25-2021 20:44   ECHOCARDIOGRAM COMPLETE  Result Date: 07/05/2021    ECHOCARDIOGRAM REPORT   Patient Name:   Kristine Sims Date of Exam: 07/05/2021 Medical Rec #:  [id number]     Height:       63.0 in Accession #:    [id number]    Weight:       230.2 lb Date of Birth:  04-19-1971      BSA:          2.053 m Patient Age:    50 years      BP:           98/56 mmHg Patient Gender: F  HR:           110 bpm. Exam Location:  Inpatient Procedure: 2D Echo, Cardiac Doppler, Color Doppler and Intracardiac            Opacification Agent Indications:    ETOH abuse   History:        Patient has no prior history of Echocardiogram examinations.  Sonographer:    Cleatis Polka Referring Phys: [id number] JENNIFER LYNNE LEMMON IMPRESSIONS  1. Left ventricular ejection fraction, by estimation, is 65 to 70%. The left ventricle has hyperdynamic function. The left ventricle has no regional wall motion abnormalities. Left ventricular diastolic parameters are consistent with Grade I diastolic dysfunction (impaired relaxation).  2. Right ventricular systolic function is normal. The right ventricular size is normal. Tricuspid regurgitation signal is inadequate for assessing PA pressure.  3. The mitral valve is normal in structure. Trivial mitral valve regurgitation. No evidence of mitral stenosis.  4. The aortic valve is tricuspid. Aortic valve regurgitation is not visualized. Aortic valve mean gradient measures 14.0 mmHg. Elevated gradient across the aortic valve but the valve opens normally visually. Suspect elevated gradient is due to hyperdynamic LV function. There also does not appear to be LV outflow obstruction.  5. The inferior vena cava is normal in size with greater than 50% respiratory variability, suggesting right atrial pressure of 3 mmHg. FINDINGS  Left Ventricle: Left ventricular ejection fraction, by estimation, is 65 to 70%. The left ventricle has hyperdynamic function. The left ventricle has no regional wall motion abnormalities. The left ventricular internal cavity size was normal in size. There is no left ventricular hypertrophy. Left ventricular diastolic parameters are consistent with Grade I diastolic dysfunction (impaired relaxation). Right Ventricle: The right ventricular size is normal. No increase in right ventricular wall thickness. Right ventricular systolic function is normal. Tricuspid regurgitation signal is inadequate for assessing PA pressure. Left Atrium: Left atrial size was normal in size. Right Atrium: Right atrial size was normal in size. Pericardium: There  is no evidence of pericardial effusion. Mitral Valve: The mitral valve is normal in structure. Trivial mitral valve regurgitation. No evidence of mitral valve stenosis. Tricuspid Valve: The tricuspid valve is normal in structure. Tricuspid valve regurgitation is not demonstrated. Aortic Valve: The aortic valve is tricuspid. Aortic valve regurgitation is not visualized. Aortic valve mean gradient measures 14.0 mmHg. Aortic valve peak gradient measures 25.1 mmHg. Aortic valve area, by VTI measures 2.56 cm. Pulmonic Valve: The pulmonic valve was normal in structure. Pulmonic valve regurgitation is not visualized. Aorta: The aortic root is normal in size and structure. Venous: The inferior vena cava is normal in size with greater than 50% respiratory variability, suggesting right atrial pressure of 3 mmHg. IAS/Shunts: No atrial level shunt detected by color flow Doppler.  LEFT VENTRICLE PLAX 2D LVIDd:         5.20 cm     Diastology LVIDs:         2.50 cm     LV e' medial:    13.90 cm/s LV PW:         1.00 cm     LV E/e' medial:  6.6 LV IVS:        1.00 cm     LV e' lateral:   11.70 cm/s LVOT diam:     2.10 cm     LV E/e' lateral: 7.8 LV SV:         101 LV SV Index:   49 LVOT Area:     3.46  cm  LV Volumes (MOD) LV vol d, MOD A2C: 98.4 ml LV vol d, MOD A4C: 83.5 ml LV vol s, MOD A2C: 32.4 ml LV vol s, MOD A4C: 26.4 ml LV SV MOD A2C:     66.0 ml LV SV MOD A4C:     83.5 ml LV SV MOD BP:      62.1 ml RIGHT VENTRICLE RV Basal diam:  3.10 cm RV S prime:     21.70 cm/s TAPSE (M-mode): 3.1 cm LEFT ATRIUM             Index        RIGHT ATRIUM           Index LA diam:        3.80 cm 1.85 cm/m   RA Area:     16.70 cm LA Vol (A2C):   43.4 ml 21.14 ml/m  RA Volume:   38.30 ml  18.66 ml/m LA Vol (A4C):   54.8 ml 26.70 ml/m LA Biplane Vol: 51.7 ml 25.19 ml/m  AORTIC VALVE AV Area (Vmax):    2.34 cm AV Area (Vmean):   2.43 cm AV Area (VTI):     2.56 cm AV Vmax:           250.50 cm/s AV Vmean:          181.000 cm/s AV VTI:             0.396 m AV Peak Grad:      25.1 mmHg AV Mean Grad:      14.0 mmHg LVOT Vmax:         169.50 cm/s LVOT Vmean:        127.000 cm/s LVOT VTI:          0.292 m LVOT/AV VTI ratio: 0.74  AORTA Ao Root diam: 3.00 cm Ao Asc diam:  3.00 cm MITRAL VALVE MV Area (PHT): 6.48 cm     SHUNTS MV Decel Time: 117 msec     Systemic VTI:  0.29 m MV E velocity: 91.30 cm/s   Systemic Diam: 2.10 cm MV A velocity: 107.00 cm/s MV E/A ratio:  0.85 Dalton McleanMD Electronically signed by Wilfred Lacy Signature Date/Time: 07/05/2021/3:13:09 PM    Final    US Abdomen Limited RUQ (LIVER/GB)  Result Date: 06/11/2021 CLINICAL DATA:  Jaundice and cirrhosis. EXAM: ULTRASOUND ABDOMEN LIMITED RIGHT UPPER QUADRANT COMPARISON:  None. FINDINGS: Gallbladder: No gallstones or wall thickening visualized. No sonographic Murphy sign noted by sonographer. Common bile duct: Diameter: Not well seen.  Approximately 3-4 mm diameter. Liver: Markedly echogenic and heterogeneous liver parenchyma with nodular contour. Portal vein is patent on color Doppler imaging with normal direction of blood flow towards the liver. Other: None. IMPRESSION: Nodular, echogenic liver parenchyma compatible with cirrhosis. No focal abnormality evident although assessment limited due to poor acoustic through transmission. No biliary dilatation Electronically Signed   By: Kennith Center M.D.   On: 07/02/2021 15:02   IR Paracentesis  Result Date: 06/26/2021 INDICATION: Patient with a history of alcohol abuse and cirrhosis presents today with ascites. Interventional Radiology asked to perform a diagnostic and therapeutic paracentesis EXAM: ULTRASOUND GUIDED PARACENTESIS MEDICATIONS: 1% lidocaine 15 mL COMPLICATIONS: None immediate. PROCEDURE: Informed written consent was obtained from the patient after a discussion of the risks, benefits and alternatives to treatment. A timeout was performed prior to the initiation of the procedure. Initial ultrasound scanning  demonstrates a large amount of ascites within the left lower abdominal quadrant. The left lower abdomen  was prepped and draped in the usual sterile fashion. 1% lidocaine was used for local anesthesia. Following this, a 19 gauge, 15-cm, Yueh catheter was introduced. An ultrasound image was saved for documentation purposes. The paracentesis was performed. The catheter was removed and a dressing was applied. The patient tolerated the procedure well without immediate post procedural complication. FINDINGS: A total of approximately 800 mL of orange yellow fluid was removed. Samples were sent to the laboratory as requested by the clinical team. IMPRESSION: Successful ultrasound-guided paracentesis yielding 800 mL of peritoneal fluid. Read by: Alwyn Ren, NP Electronically Signed   By: Marliss Coots M.D.   On: 06/22/2021 16:19    Microbiology Recent Results (from the past 240 hour(s))  Acid Fast Smear (AFB)     Status: None   Collection Time: 06/25/2021  4:04 PM   Specimen: Abdomen; Peritoneal Fluid  Result Value Ref Range Status   AFB Specimen Processing Concentration  Final   Acid Fast Smear Negative  Final    Comment: (NOTE) Performed At: [facility] [address] [place], [place] [id number] Jolene Schimke MD SW:[id number]    Source (AFB) FLUID  Final    Comment: PERITONEAL ABDOMEN Performed at [facility], [address], [place], [place] [address]   Culture, body fluid w Gram Stain-bottle     Status: None (Preliminary result)   Collection Time: 06/21/2021  4:04 PM   Specimen: Fluid  Result Value Ref Range Status   Specimen Description FLUID PERITONEAL ABDOMEN  Final   Special Requests BOTTLES DRAWN AEROBIC AND ANAEROBIC  Final   Culture   Final    NO GROWTH 4 DAYS Performed at [facility], [address] [place], [place] [address]    Report Status PENDING  Incomplete  Gram stain     Status: None   Collection Time: 07/07/2021  4:04 PM   Specimen: Fluid  Result  Value Ref Range Status   Specimen Description FLUID PERITONEAL ABDOMEN  Final   Special Requests NONE  Final   Gram Stain   Final    NO ORGANISMS SEEN SQUAMOUS EPITHELIAL CELLS PRESENT WBC PRESENT, PREDOMINANTLY MONONUCLEAR GRAM POSITIVE COCCI CYTOSPIN SMEAR Performed at [facility], [address] [place], [place] [address]    Report Status 07/03/2021 FINAL  Final  Resp Panel by RT-PCR (Flu A&B, Covid) Nasopharyngeal Swab     Status: None   Collection Time: 06/17/2021  4:11 PM   Specimen: Nasopharyngeal Swab; Nasopharyngeal(NP) swabs in vial transport medium  Result Value Ref Range Status   SARS Coronavirus 2 by RT PCR NEGATIVE NEGATIVE Final    Comment: (NOTE) SARS-CoV-2 target nucleic acids are NOT DETECTED.  The SARS-CoV-2 RNA is generally detectable in upper respiratory specimens during the acute phase of infection. The lowest concentration of SARS-CoV-2 viral copies this assay can detect is 138 copies/mL. A negative result does not preclude SARS-Cov-2 infection and should not be used as the sole basis for treatment or other patient management decisions. A negative result may occur with  improper specimen collection/handling, submission of specimen other than nasopharyngeal swab, presence of viral mutation(s) within the areas targeted by this assay, and inadequate number of viral copies(<138 copies/mL). A negative result must be combined with clinical observations, patient history, and epidemiological information. The expected result is Negative.  Fact Sheet for Patients:  BloggerCourse.com  Fact Sheet for Healthcare Providers:  SeriousBroker.it  This test is no t yet approved or cleared by the [place] FDA  and  has been authorized for detection and/or diagnosis of SARS-CoV-2 by FDA under an Emergency Use Authorization (EUA). This EUA will remain  in effect (meaning this test can be used) for the duration of  the COVID-19 declaration under Section 564(b)(1) of the Act, 21 U.S.C.section 360bbb-3(b)(1), unless the authorization is terminated  or revoked sooner.       Influenza A by PCR NEGATIVE NEGATIVE Final   Influenza B by PCR NEGATIVE NEGATIVE Final    Comment: (NOTE) The Xpert Xpress SARS-CoV-2/FLU/RSV plus assay is intended as an aid in the diagnosis of influenza from Nasopharyngeal swab specimens and should not be used as a sole basis for treatment. Nasal washings and aspirates are unacceptable for Xpert Xpress SARS-CoV-2/FLU/RSV testing.  Fact Sheet for Patients: BloggerCourse.com  Fact Sheet for Healthcare Providers: SeriousBroker.it  This test is not yet approved or cleared by the [place] FDA and has been authorized for detection and/or diagnosis of SARS-CoV-2 by FDA under an Emergency Use Authorization (EUA). This EUA will remain in effect (meaning this test can be used) for the duration of the COVID-19 declaration under Section 564(b)(1) of the Act, 21 U.S.C. section 360bbb-3(b)(1), unless the authorization is terminated or revoked.  Performed at [facility], [address], [place], [place] [address]     Lab Basic Metabolic Panel: Recent Labs  Lab 07/05/21 0201 07/05/21 0731 07/05/21 1156 07/05/21 1556 07/06/21 0324  NA 117* 117* 117* 118* 116*  K 4.4 4.6 4.7 5.0 4.4  CL 75* 75* 76* 74* 74*  CO2 20* 23 23 23 23   GLUCOSE 73 84 88 98 160*  BUN 36* 41* 43* 47* 54*  CREATININE 1.94* 2.22* 2.37* 2.41* 2.46*  CALCIUM 8.4* 8.0* 8.1* 8.4* 8.1*  MG  --   --   --   --  2.9*  PHOS  --   --   --   --  5.8*   Liver Function Tests: Recent Labs  Lab 2021/07/09 1310 07/05/21 0201 07/06/21 0324  AST 232* 209* 187*  ALT 59* 58* 53*  ALKPHOS 157* 148* 114  BILITOT 21.7* 22.9* 23.7*  PROT 7.7 7.2 7.3  ALBUMIN 2.4* 2.2* 2.7*   Recent Labs  Lab 2021/07/09 1310 07/06/21 0324  LIPASE 407* 205*   Recent  Labs  Lab 09-Jul-2021 1813 07/06/21 0324  AMMONIA 75* 57*   CBC: Recent Labs  Lab 2021/07/09 1310 09-Jul-2021 2205 07/05/21 0201 07/05/21 0731 07/05/21 1556 07/06/21 0324  WBC 15.6*  --  16.0*  --   --  15.0*  NEUTROABS 12.5*  --   --   --   --  12.4*  HGB 8.3* 7.8* 7.4* 7.1* 7.2* 6.6*  HCT 23.3* 21.9* 21.2* 20.3* 19.9* 18.9*  MCV 106.9*  --  107.6*  --   --  109.9*  PLT 172  --  156  --   --  141*   Cardiac Enzymes: Recent Labs  Lab 07/06/21 0324  CKTOTAL 35*   Sepsis Labs: Recent Labs  Lab July 09, 2021 1310 07/05/21 0201 07/06/21 0324  WBC 15.6* 16.0* 15.0*    Procedures/Operations  Paracentesis   Jezlyn Westerfield T Salem Mastrogiovanni 07/09/2021, 6:09 PM

## 2021-07-11 NOTE — Progress Notes (Signed)
Pt expired at 14:55 pm. Notified Dr. Alanda Slim and CDS. Notified bed placement. Gave complete bath and prepared for transportation to [facility].

## 2021-07-11 DEATH — deceased

## 2021-08-17 LAB — ACID FAST CULTURE WITH REFLEXED SENSITIVITIES (MYCOBACTERIA): Acid Fast Culture: NEGATIVE

## 2021-12-15 ENCOUNTER — Ambulatory Visit: Payer: BC Managed Care – PPO | Admitting: Neurology
